# Patient Record
Sex: Female | Born: 1935 | Race: White | Hispanic: No | Marital: Single | Smoking: Former smoker
Health system: Southern US, Community
[De-identification: ages and names within clinical notes are randomized; demographics above are authoritative.]

## PROBLEM LIST (undated history)

## (undated) DIAGNOSIS — M810 Age-related osteoporosis without current pathological fracture: Secondary | ICD-10-CM

## (undated) DIAGNOSIS — R002 Palpitations: Secondary | ICD-10-CM

## (undated) DIAGNOSIS — Z8709 Personal history of other diseases of the respiratory system: Secondary | ICD-10-CM

## (undated) DIAGNOSIS — K5790 Diverticulosis of intestine, part unspecified, without perforation or abscess without bleeding: Secondary | ICD-10-CM

## (undated) DIAGNOSIS — I8393 Asymptomatic varicose veins of bilateral lower extremities: Secondary | ICD-10-CM

## (undated) DIAGNOSIS — E785 Hyperlipidemia, unspecified: Secondary | ICD-10-CM

## (undated) DIAGNOSIS — T8859XA Other complications of anesthesia, initial encounter: Secondary | ICD-10-CM

## (undated) DIAGNOSIS — E119 Type 2 diabetes mellitus without complications: Secondary | ICD-10-CM

## (undated) DIAGNOSIS — S2249XA Multiple fractures of ribs, unspecified side, initial encounter for closed fracture: Secondary | ICD-10-CM

## (undated) DIAGNOSIS — T4145XA Adverse effect of unspecified anesthetic, initial encounter: Secondary | ICD-10-CM

## (undated) DIAGNOSIS — S299XXA Unspecified injury of thorax, initial encounter: Secondary | ICD-10-CM

## (undated) DIAGNOSIS — I10 Essential (primary) hypertension: Secondary | ICD-10-CM

## (undated) DIAGNOSIS — S0292XA Unspecified fracture of facial bones, initial encounter for closed fracture: Secondary | ICD-10-CM

## (undated) DIAGNOSIS — S2239XA Fracture of one rib, unspecified side, initial encounter for closed fracture: Secondary | ICD-10-CM

## (undated) DIAGNOSIS — K649 Unspecified hemorrhoids: Secondary | ICD-10-CM

## (undated) HISTORY — DX: Essential (primary) hypertension: I10

## (undated) HISTORY — DX: Hyperlipidemia, unspecified: E78.5

## (undated) HISTORY — DX: Age-related osteoporosis without current pathological fracture: M81.0

## (undated) HISTORY — PX: APPENDECTOMY: SHX54

## (undated) HISTORY — PX: VARICOSE VEIN SURGERY: SHX832

## (undated) HISTORY — PX: CHOLECYSTECTOMY: SHX55

## (undated) HISTORY — PX: EYE SURGERY: SHX253

## (undated) HISTORY — DX: Diverticulosis of intestine, part unspecified, without perforation or abscess without bleeding: K57.90

## (undated) HISTORY — DX: Unspecified hemorrhoids: K64.9

## (undated) HISTORY — DX: Type 2 diabetes mellitus without complications: E11.9

## (undated) HISTORY — PX: HEMORRHOID SURGERY: SHX153

## (undated) HISTORY — PX: TUBAL LIGATION: SHX77

---

## 2014-04-11 ENCOUNTER — Encounter: Payer: Self-pay | Admitting: *Deleted

## 2014-04-12 ENCOUNTER — Encounter: Payer: Self-pay | Admitting: Cardiology

## 2014-04-12 ENCOUNTER — Ambulatory Visit (INDEPENDENT_AMBULATORY_CARE_PROVIDER_SITE_OTHER): Payer: Medicare HMO | Admitting: Cardiology

## 2014-04-12 VITALS — BP 130/72 | HR 59 | Ht >= 80 in | Wt 205.8 lb

## 2014-04-12 DIAGNOSIS — R002 Palpitations: Secondary | ICD-10-CM

## 2014-04-12 NOTE — Progress Notes (Signed)
HPI The patient presents for evaluation of palpitations.  She reports that she has had 2 episodes of this about 2 weeks apart.  The first episode occurred after she was taking a shower.  She said that her heart started beating fast.  She felt anxious.  She said that her rate was about 99 when she took it.  She had to go rest and she thinks her symptoms slowly resolved.  However, she did not feel well after this.  She denies any syncope.  She had another episode while at her MDs office on 6/2.  She didn't necessarily feel her heart beating fast at that time but she did have an increased HR in the high 90s.  She says that because of these episodes she has not wanted to do any activity including walking to her mailbox.  She has had some chronic DOE that has slowly progressed over time.  She has had some mild chest tightness with activity but this has been chronic and she does not think that this has gotten worse.    Not on File  Current Outpatient Prescriptions  Medication Sig Dispense Refill  . aspirin 81 MG tablet Take 81 mg by mouth daily.      . Cholecalciferol (D2000 ULTRA STRENGTH) 2000 UNITS CAPS Take by mouth.      . cyanocobalamin (,VITAMIN B-12,) 1000 MCG/ML injection Inject into the muscle.      . Cyanocobalamin (VITAMIN B-12) 2500 MCG SUBL Place under the tongue.      . pentoxifylline (TRENTAL) 400 MG CR tablet       . simvastatin (ZOCOR) 20 MG tablet       . valsartan-hydrochlorothiazide (DIOVAN-HCT) 160-12.5 MG per tablet       . verapamil (CALAN) 120 MG tablet        No current facility-administered medications for this visit.    Past Medical History  Diagnosis Date  . HTN (hypertension)   . Diabetes mellitus     borderline  . Hyperlipidemia   . Osteoporosis   . Hemorrhoid   . Diverticulosis     Past Surgical History  Procedure Laterality Date  . Cholecystectomy    . Tubal ligation    . Varicose vein surgery      Family History  Problem Relation Age of Onset  .  Colon cancer Mother     History   Social History  . Marital Status: Single    Spouse Name: N/A    Number of Children: 5  . Years of Education: N/A   Occupational History  . Not on file.   Social History Main Topics  . Smoking status: Never Smoker   . Smokeless tobacco: Not on file  . Alcohol Use: Not on file  . Drug Use: Not on file  . Sexual Activity: Not on file   Other Topics Concern  . Not on file   Social History Narrative   Lives alone.     ROS:  PHYSICAL EXAM BP 130/72  Pulse 59  Ht 7' (2.134 m)  Wt 205 lb 12.8 oz (93.35 kg)  BMI 20.50 kg/m2 GENERAL:  Well appearing HEENT:  Pupils equal round and reactive, fundi not visualized, oral mucosa unremarkable NECK:  No jugular venous distention, waveform within normal limits, carotid upstroke brisk and symmetric, no bruits, no thyromegaly LYMPHATICS:  No cervical, inguinal adenopathy LUNGS:  Clear to auscultation bilaterally BACK:  No CVA tenderness CHEST:  Unremarkable HEART:  PMI not displaced or sustained,S1 and  S2 within normal limits, no S3, no S4, no clicks, no rubs, no murmurs ABD:  Flat, positive bowel sounds normal in frequency in pitch, no bruits, no rebound, no guarding, no midline pulsatile mass, no hepatomegaly, no splenomegaly EXT:  2 plus pulses throughout, no edema, no cyanosis no clubbing, varicose veins.  SKIN:  No rashes no nodules NEURO:  Cranial nerves II through XII grossly intact, motor grossly intact throughout PSYCH:  Cognitively intact, oriented to person place and time   EKG:   NSR, rate 74, axis WNL, intervals WNL, low voltage limb leads.  No acute ST T wave changes.  04/12/2014  ASSESSMENT AND PLAN  PALPITATIONS: I will start with a 48 hour Holter.  Of note labs, including TSH, are normal.  If this is negative I will likely check a POET (Plain Old Exercise Treadmill).  After these results she and I can discuss by phone, since she lives 2.5 hours away, the results and possible therapy.     HTN:   The blood pressure is at target. No change in medications is indicated. We will continue with therapeutic lifestyle changes (TLC).

## 2014-04-12 NOTE — Patient Instructions (Signed)
The current medical regimen is effective;  continue present plan and medications.  Your physician has recommended that you wear a holter monitor. Holter monitors are medical devices that record the heart's electrical activity. Doctors most often use these monitors to diagnose arrhythmias. Arrhythmias are problems with the speed or rhythm of the heartbeat. The monitor is a small, portable device. You can wear one while you do your normal daily activities. This is usually used to diagnose what is causing palpitations/syncope (passing out).  Your physician has requested that you have an exercise tolerance test. For further information please visit [email or web address] Please also follow instruction sheet, as given.

## 2014-04-18 ENCOUNTER — Encounter (INDEPENDENT_AMBULATORY_CARE_PROVIDER_SITE_OTHER): Payer: Medicare HMO

## 2014-04-18 ENCOUNTER — Encounter: Payer: Self-pay | Admitting: Radiology

## 2014-04-18 DIAGNOSIS — R002 Palpitations: Secondary | ICD-10-CM

## 2014-04-18 NOTE — Progress Notes (Signed)
Patient ID: Heather Cisneros, female   DOB: 05/15/1936, 78 y.o.   MRN: [id number] 24hr holter monitor applied

## 2014-04-21 ENCOUNTER — Telehealth (HOSPITAL_COMMUNITY): Payer: Self-pay

## 2014-04-22 ENCOUNTER — Ambulatory Visit (HOSPITAL_COMMUNITY)
Admission: RE | Admit: 2014-04-22 | Discharge: 2014-04-22 | Disposition: A | Discharge status: Home / Self Care | Payer: Medicare HMO | Source: Ambulatory Visit | Attending: Cardiovascular Disease | Admitting: Cardiovascular Disease

## 2014-04-22 DIAGNOSIS — R000 Tachycardia, unspecified: Secondary | ICD-10-CM

## 2014-04-22 DIAGNOSIS — R002 Palpitations: Secondary | ICD-10-CM

## 2014-04-22 DIAGNOSIS — I10 Essential (primary) hypertension: Secondary | ICD-10-CM

## 2014-04-22 NOTE — Procedures (Addendum)
Exercise Treadmill Test  Pre-Exercise Testing Evaluation Rhythm: normal sinus  Rate: 81   PR:  .17 QRS:  .08  QT:  .34 QTc: .36      ST Segments:  no significant ST changes at rest     Test  Exercise Tolerance Test Ordering MD: Minus Breeding, MD  Interpreting MD  Corky Downs, MD  Unique Test No: 1  Treadmill:  1  Indication for ETT: chest pain - rule out ischemia  Contraindication to ETT: Yes   Stress Modality: exercise - treadmill  Cardiac Imaging Performed: non   Protocol: standard Bruce - maximal  Max BP:  153/80  Max MPHR (bpm):  145 85% MPR (bpm):  123  MPHR obtained (bpm):  144 % MPHR obtained:  99  Reached 85% MPHR (min:sec): 2:20 Total Exercise Time (min-sec):  5:22  Workload in METS:  7.00   Reason ETT Terminated:  fatigue    ST Segment Analysis At Rest: normal ST segments - no evidence of significant ST depression With Exercise: no evidence of significant ST depression  Other Information Arrhythmia:  No Angina during ETT:  absent (0) Quality of ETT:  diagnostic  ETT Interpretation:  normal - no evidence of ischemia by ST analysis  Comments: Normal GXT with patient exercising to a 7 met workload and 99% APMHR. Duke Treadmill Score 6.

## 2014-04-29 NOTE — Telephone Encounter (Signed)
The pt states that Dr Antoine PocheHochrein called her day before yesterday (Wednesday 6/24) and left a message on her VM stating that he wanted to talk to her about some test results. I discussed the preliminary results of her GXT with her. She is advised that Dr Antoine PocheHochrein is not in this office today and that I am forwarding this message to him so he will know that she did call him back.

## 2014-04-29 NOTE — Telephone Encounter (Signed)
Called to discuss.  No change in therapy.

## 2014-04-29 NOTE — Telephone Encounter (Signed)
The pts phone is busy, will continue to call.

## 2014-04-29 NOTE — Telephone Encounter (Signed)
New message  ° ° °Patient calling for test results.   °

## 2015-06-14 ENCOUNTER — Other Ambulatory Visit: Payer: Self-pay | Admitting: Gastroenterology

## 2015-06-14 ENCOUNTER — Encounter (HOSPITAL_COMMUNITY): Payer: Self-pay | Admitting: *Deleted

## 2015-06-20 ENCOUNTER — Encounter (HOSPITAL_COMMUNITY): Payer: Self-pay

## 2015-06-20 ENCOUNTER — Encounter (HOSPITAL_COMMUNITY)
Admission: RE | Disposition: A | Discharge status: Home / Self Care | Payer: Self-pay | Source: Ambulatory Visit | Attending: Gastroenterology

## 2015-06-20 ENCOUNTER — Ambulatory Visit (HOSPITAL_COMMUNITY): Payer: Medicare PPO | Admitting: Anesthesiology

## 2015-06-20 ENCOUNTER — Ambulatory Visit (HOSPITAL_COMMUNITY)
Admission: RE | Admit: 2015-06-20 | Discharge: 2015-06-20 | Disposition: A | Discharge status: Home / Self Care | Payer: Medicare PPO | Source: Ambulatory Visit | Attending: Gastroenterology | Admitting: Gastroenterology

## 2015-06-20 DIAGNOSIS — D509 Iron deficiency anemia, unspecified: Secondary | ICD-10-CM | POA: Insufficient documentation

## 2015-06-20 DIAGNOSIS — Z7982 Long term (current) use of aspirin: Secondary | ICD-10-CM | POA: Insufficient documentation

## 2015-06-20 DIAGNOSIS — K449 Diaphragmatic hernia without obstruction or gangrene: Secondary | ICD-10-CM | POA: Insufficient documentation

## 2015-06-20 DIAGNOSIS — E119 Type 2 diabetes mellitus without complications: Secondary | ICD-10-CM | POA: Insufficient documentation

## 2015-06-20 DIAGNOSIS — K2970 Gastritis, unspecified, without bleeding: Secondary | ICD-10-CM | POA: Insufficient documentation

## 2015-06-20 DIAGNOSIS — I10 Essential (primary) hypertension: Secondary | ICD-10-CM | POA: Insufficient documentation

## 2015-06-20 DIAGNOSIS — Z800 Family history of malignant neoplasm of digestive organs: Secondary | ICD-10-CM | POA: Insufficient documentation

## 2015-06-20 DIAGNOSIS — E780 Pure hypercholesterolemia: Secondary | ICD-10-CM | POA: Diagnosis not present

## 2015-06-20 DIAGNOSIS — R195 Other fecal abnormalities: Secondary | ICD-10-CM | POA: Diagnosis not present

## 2015-06-20 DIAGNOSIS — K5900 Constipation, unspecified: Secondary | ICD-10-CM | POA: Diagnosis not present

## 2015-06-20 DIAGNOSIS — Z86010 Personal history of colonic polyps: Secondary | ICD-10-CM | POA: Insufficient documentation

## 2015-06-20 DIAGNOSIS — Z87891 Personal history of nicotine dependence: Secondary | ICD-10-CM | POA: Diagnosis not present

## 2015-06-20 HISTORY — PX: COLONOSCOPY WITH PROPOFOL: SHX5780

## 2015-06-20 HISTORY — PX: ESOPHAGOGASTRODUODENOSCOPY (EGD) WITH PROPOFOL: SHX5813

## 2015-06-20 SURGERY — ESOPHAGOGASTRODUODENOSCOPY (EGD) WITH PROPOFOL
Anesthesia: Monitor Anesthesia Care

## 2015-06-20 MED ORDER — BUTAMBEN-TETRACAINE-BENZOCAINE 2-2-14 % EX AERO
INHALATION_SPRAY | CUTANEOUS | Status: DC | PRN
  Administered 2015-06-20: 1 via TOPICAL

## 2015-06-20 MED ORDER — LIDOCAINE HCL (CARDIAC) 20 MG/ML IV SOLN
INTRAVENOUS | Status: AC
  Filled 2015-06-20: qty 5

## 2015-06-20 MED ORDER — PROPOFOL 10 MG/ML IV BOLUS
INTRAVENOUS | Status: AC
  Filled 2015-06-20: qty 20

## 2015-06-20 MED ORDER — PROPOFOL 500 MG/50ML IV EMUL
INTRAVENOUS | Status: DC | PRN
  Administered 2015-06-20: 10 mg via INTRAVENOUS
  Administered 2015-06-20: 20 mg via INTRAVENOUS
  Administered 2015-06-20: 40 mg via INTRAVENOUS
  Administered 2015-06-20: 10 mg via INTRAVENOUS

## 2015-06-20 MED ORDER — PROPOFOL INFUSION 10 MG/ML OPTIME
INTRAVENOUS | Status: DC | PRN
  Administered 2015-06-20: 100 ug/kg/min via INTRAVENOUS

## 2015-06-20 MED ORDER — SODIUM CHLORIDE 0.9 % IV SOLN: INTRAVENOUS | Status: DC

## 2015-06-20 MED ORDER — LACTATED RINGERS IV SOLN
INTRAVENOUS | Status: DC
  Administered 2015-06-20: 1000 mL via INTRAVENOUS

## 2015-06-20 SURGICAL SUPPLY — 24 items

## 2015-06-20 NOTE — H&P (Signed)
  Problem: Iron deficiency anemia. Heme positive stool on aspirin. Hemoglobin 11.7 g. Serum iron saturation 20%. Serum ferritin 7.5 ng/mL. Chronic daily aspirin use. Daily heartburn. 12/12/2011 colonoscopy performed with removal of a diminutive transverse colon polyp and [place], [place]  History: The patient is a 79 year old female born 01-20-1936. The patient was recently diagnosed with iron deficiency anemia, vitamin B 12 deficiency, and heme positive stool taking aspirin 81 mg daily. She denies abdominal pain, nausea, vomiting, diarrhea, gastrointestinal bleeding, hematuria, or hemoptysis.  Her mother was diagnosed with colon cancer. Patient underwent a colonoscopy in February 2013 with removal of a diminutive transverse colon polyp.  The patient has chronic constipation. She has been experiencing intermittent heartburn without dysphagia or odynophagia. She submitted stool cards to her primary care physician and 2 of 3 cards were positive for blood.  The patient is scheduled to undergo diagnostic esophagogastroduodenoscopy with small bowel biopsies and gastric biopsies followed by diagnostic colonoscopy to evaluate iron deficiency anemia and heme positive stool.  Medication allergies: None  Past medical history: Type 2 diabetes mellitus. Hypertension. Hypercholesterolemia. Heartburn. Vitamin B 12 deficiency. Cholecystectomy. Tubal ligation. Hemorrhoid surgery. Varicose pain surgery.  Exam: Patient is alert and lying comfortably on the endoscopy stretcher. Abdomen is soft and nontender to palpation. Lungs are clear to auscultation. Cardiac exam reveals a regular rhythm.  Plan: Proceed with diagnostic esophagogastroduodenoscopy with small bowel biopsies to rule out villous atrophy associated with celiac disease, gastric biopsies to look for H. pylori gastritis, followed by diagnostic colonoscopy.

## 2015-06-20 NOTE — Anesthesia Postprocedure Evaluation (Signed)
  Anesthesia Post-op Note  Patient: Heather Cisneros  Procedure(s) Performed: Procedure(s) (LRB): ESOPHAGOGASTRODUODENOSCOPY (EGD) WITH PROPOFOL (N/A) COLONOSCOPY WITH PROPOFOL (N/A)  Patient Location: PACU  Anesthesia Type: MAC  Level of Consciousness: awake and alert   Airway and Oxygen Therapy: Patient Spontanous Breathing  Post-op Pain: mild  Post-op Assessment: Post-op Vital signs reviewed, Patient's Cardiovascular Status Stable, Respiratory Function Stable, Patent Airway and No signs of Nausea or vomiting  Last Vitals:  Filed Vitals:   06/20/15 1520  BP: 160/93  Pulse: 68  Temp:   Resp: 13    Post-op Vital Signs: stable   Complications: No apparent anesthesia complications

## 2015-06-20 NOTE — Discharge Instructions (Addendum)
Colonoscopy, Care After °These instructions give you information on caring for yourself after your procedure. Your doctor may also give you more specific instructions. Call your doctor if you have any problems or questions after your procedure. °HOME CARE °· Do not drive for 24 hours. °· Do not sign important papers or use machinery for 24 hours. °· You may shower. °· You may go back to your usual activities, but go slower for the first 24 hours. °· Take rest breaks often during the first 24 hours. °· Walk around or use warm packs on your belly (abdomen) if you have belly cramping or gas. °· Drink enough fluids to keep your pee (urine) clear or pale yellow. °· Resume your normal diet. Avoid heavy or fried foods. °· Avoid drinking alcohol for 24 hours or as told by your doctor. °· Only take medicines as told by your doctor. °If a tissue sample (biopsy) was taken during the procedure:  °· Do not take aspirin or blood thinners for 7 days, or as told by your doctor. °· Do not drink alcohol for 7 days, or as told by your doctor. °· Eat soft foods for the first 24 hours. °GET HELP IF: °You still have a small amount of blood in your poop (stool) 2-3 days after the procedure. °GET HELP RIGHT AWAY IF: °· You have more than a small amount of blood in your poop. °· You see clumps of tissue (blood clots) in your poop. °· Your belly is puffy (swollen). °· You feel sick to your stomach (nauseous) or throw up (vomit). °· You have a fever. °· You have belly pain that gets worse and medicine does not help. °MAKE SURE YOU: °· Understand these instructions. °· Will watch your condition. °· Will get help right away if you are not doing well or get worse. °Document Released: 11/23/2010 Document Revised: 10/26/2013 Document Reviewed: 06/28/2013 °ExitCare® Patient Information ©2015 [employer]. This information is not intended to replace advice given to you by your health care provider. Make sure you discuss any questions you have with  your health care provider. °Esophagogastroduodenoscopy °Care After °Refer to this sheet in the next few weeks. These instructions provide you with information on caring for yourself after your procedure. Your caregiver may also give you more specific instructions. Your treatment has been planned according to current medical practices, but problems sometimes occur. Call your caregiver if you have any problems or questions after your procedure.  °HOME CARE INSTRUCTIONS °· Do not eat or drink anything until the numbing medicine (local anesthetic) has worn off and your gag reflex has returned. You will know that the local anesthetic has worn off when you can swallow comfortably. °· Do not drive for 12 hours after the procedure or as directed by your caregiver. °· Only take medicines as directed by your caregiver. °SEEK MEDICAL CARE IF:  °· You cannot stop coughing. °· You are not urinating at all or less than usual. °SEEK IMMEDIATE MEDICAL CARE IF: °· You have difficulty swallowing. °· You cannot eat or drink. °· You have worsening throat or chest pain. °· You have dizziness, lightheadedness, or you faint. °· You have nausea or vomiting. °· You have chills. °· You have a fever. °· You have severe abdominal pain. °· You have black, tarry, or bloody stools. °Document Released: 10/07/2012 Document Reviewed: 10/07/2012 °ExitCare® Patient Information ©2015 [employer]. This information is not intended to replace advice given to you by your health care provider. Make sure you   discuss any questions you have with your health care provider. ° °

## 2015-06-20 NOTE — Transfer of Care (Signed)
Immediate Anesthesia Transfer of Care Note  Patient: Heather Cisneros  Procedure(s) Performed: Procedure(s): ESOPHAGOGASTRODUODENOSCOPY (EGD) WITH PROPOFOL (N/A) COLONOSCOPY WITH PROPOFOL (N/A)  Patient Location: PACU  Anesthesia Type:MAC  Level of Consciousness:  sedated, patient cooperative and responds to stimulation  Airway & Oxygen Therapy:Patient Spontanous Breathing  Post-op Assessment:  Report given to PACU RN and Post -op Vital signs reviewed and stable  Post vital signs:  Reviewed and stable  Last Vitals:  Filed Vitals:   06/20/15 1339  BP: 195/93  Pulse: 71  Temp: 36.7 C  Resp: 16    Complications: No apparent anesthesia complications

## 2015-06-20 NOTE — Anesthesia Preprocedure Evaluation (Addendum)
Anesthesia Evaluation  Patient identified by MRN, date of birth, ID band Patient awake    Reviewed: Allergy & Precautions, NPO status , Patient's Chart, lab work & pertinent test results  Airway Mallampati: II  TM Distance: >3 FB Neck ROM: Full    Dental no notable dental hx.    Pulmonary neg pulmonary ROS, former smoker,  breath sounds clear to auscultation  Pulmonary exam normal       Cardiovascular hypertension, Pt. on medications negative cardio ROS Normal cardiovascular examRhythm:Regular Rate:Normal     Neuro/Psych negative neurological ROS  negative psych ROS   GI/Hepatic negative GI ROS, Neg liver ROS,   Endo/Other  negative endocrine ROSdiabetes  Renal/GU negative Renal ROS  negative genitourinary   Musculoskeletal negative musculoskeletal ROS (+)   Abdominal   Peds negative pediatric ROS (+)  Hematology negative hematology ROS (+)   Anesthesia Other Findings   Reproductive/Obstetrics negative OB ROS                           Anesthesia Physical Anesthesia Plan  ASA: II  Anesthesia Plan: MAC   Post-op Pain Management:    Induction:   Airway Management Planned: Natural Airway  Additional Equipment:   Intra-op Plan:   Post-operative Plan:   Informed Consent: I have reviewed the patients History and Physical, chart, labs and discussed the procedure including the risks, benefits and alternatives for the proposed anesthesia with the patient or authorized representative who has indicated his/her understanding and acceptance.   Dental advisory given  Plan Discussed with: CRNA  Anesthesia Plan Comments:         Anesthesia Quick Evaluation

## 2015-06-20 NOTE — Op Note (Signed)
Problem: Iron deficiency anemia. Heme positive stool on aspirin. Hemoglobin 11.7 g. Serum iron saturation 20%. Serum ferritin 7.5 ng/mL. 12/12/2011 colonoscopy performed with removal of a diminutive transverse colon polyp in [place], [place].  Endoscopist: Danise Edge  Premedication: Propofol administered by anesthesia  Procedure: Diagnostic esophagogastroduodenoscopy The patient was placed in the left lateral decubitus position. The Pentax gastroscope was passed through the posterior hypopharynx into the proximal esophagus without difficulty. The hypopharynx, larynx, and vocal cords appeared normal.  Esophagoscopy: The proximal, mid, and lower segments of the esophageal mucosa appeared normal. The squamocolumnar junction was regular in appearance and noted at 35 cm from the incisor teeth.  Gastroscopy: There was a moderate sized hiatal hernia. Retroflexed view of the gastric cardia and fundus was normal. The diaphragmatic hiatus was patulous. The gastric body, antrum, and pylorus appeared normal. Random gastric biopsies were performed to look for H. pylori gastritis.  Duodenoscopy: The duodenal bulb and descending duodenum appeared normal. Biopsies were performed from the distal duodenal bulb and second portion of duodenum to look for signs of celiac disease.  Assessment: Normal esophagogastroduodenoscopy except for the presence of a moderate sized hiatal hernia. Duodenal biopsies to look for signs of celiac disease and gastric biopsies to look for H. pylori gastritis.  Procedure: Diagnostic colonoscopy Anal inspection and digital rectal exam were normal. The Pentax pediatric colonoscope was introduced into the rectum and advanced to the cecum. A normal-appearing ileocecal valve and appendiceal orifice were identified. Colonic preparation for the exam today was good. Withdrawal time was 11 minutes  Rectum. Normal.  Sigmoid colon and descending colon. Left colonic  diverticulosis  Splenic flexure. Normal  Transverse colon. Normal  Hepatic flexure. Normal  Ascending colon. Normal  Cecum and ileocecal valve. Normal  Assessment: Normal diagnostic colonoscopy.

## 2015-06-21 ENCOUNTER — Encounter (HOSPITAL_COMMUNITY): Payer: Self-pay | Admitting: Gastroenterology

## 2016-09-12 ENCOUNTER — Encounter: Payer: Self-pay | Admitting: Surgery

## 2016-09-12 ENCOUNTER — Ambulatory Visit: Payer: Self-pay | Admitting: Surgery

## 2016-09-12 NOTE — H&P (Signed)
Heather Cisneros 09/12/2016 10:40 AM Location: [facility] Patient #: [id number] DOB: 11/15/1935 Single / Language: [name] / Race: White Female  History of Present Illness (Heather A. Fredricka Bonineonnor MD; 09/12/2016 10:56 AM) Patient words: This is a very nice 80 year old woman with a long-standing history of lower back cyst. It is never been excised, but it has been lanced once in the past and on another occasion it ruptured without intervention. It has recurred, and has been present for about 5 years and slowly enlarging. It is not painful but she describes it as feeling "bubbly". She denies any associated fevers.  The patient is a 80 year old female.   Other Problems Heather Cisneros(Armen Glenn, CMA; 09/12/2016 10:40 AM) Back Pain Cholelithiasis Chronic Renal Failure Syndrome Hemorrhoids High blood pressure  Diagnostic Studies History Heather Cisneros(Armen Glenn, CMA; 09/12/2016 10:40 AM) Colonoscopy within last year Mammogram >3 years ago Pap Smear >5 years ago  Allergies Heather Cisneros(Armen Glenn, CMA; 09/12/2016 10:42 AM) No Known Drug Allergies 09/12/2016  Medication History (Armen Sherrine MaplesGlenn, CMA; 09/12/2016 10:46 AM) Triamcinolone Acetonide (0.1% Ointment, External) Active. Simvastatin (20MG  Tablet, Oral) Active. Verapamil HCl (120MG  Tablet, Oral) Active. Valsartan-Hydrochlorothiazide (160-12.5MG  Tablet, Oral) Active. Pentoxifylline ER (400MG  Tablet ER, Oral) Active. Caltrate 600+D3 Soft (600-800MG -UNIT Tablet Chewable, Oral) Active. Vitamin B-12 (2500MCG Tab Sublingual, Sublingual) Active. Cranberry Concentrate (500MG  Capsule, Oral) Active. Resveratrol Plus (100-100MG  Tablet, Oral) Active. Cetirizine HCl (10MG  Tablet, Oral) Active. Medications Reconciled  Social History Heather Cisneros(Armen Glenn, CMA; 09/12/2016 10:40 AM) Alcohol use Occasional alcohol use. Caffeine use Carbonated beverages, Coffee, Tea. No drug use Tobacco use Former smoker.  Family History Heather Cisneros(Armen Glenn, CMA; 09/12/2016 10:40  AM) Alcohol Abuse Father. Colon Cancer Mother. Thyroid problems Brother, Mother.  Pregnancy / Birth History Heather Cisneros(Armen Glenn, CMA; 09/12/2016 10:40 AM) Age at menarche 15 years. Age of menopause 3351-55 Gravida 5 Maternal age 78-20 Para 5     Review of Systems Heather Cisneros(Armen Glenn CMA; 09/12/2016 10:40 AM) General Not Present- Appetite Loss, Chills, Fatigue, Fever, Night Sweats, Weight Gain and Weight Loss. Skin Present- Rash. Not Present- Change in Wart/Mole, Dryness, Hives, Jaundice, New Lesions, Non-Healing Wounds and Ulcer. HEENT Present- Seasonal Allergies. Not Present- Earache, Hearing Loss, Hoarseness, Nose Bleed, Oral Ulcers, Ringing in the Ears, Sinus Pain, Sore Throat, Visual Disturbances, Wears glasses/contact lenses and Yellow Eyes. Respiratory Not Present- Bloody sputum, Chronic Cough, Difficulty Breathing, Snoring and Wheezing. Breast Not Present- Breast Mass, Breast Pain, Nipple Discharge and Skin Changes. Cardiovascular Not Present- Chest Pain, Difficulty Breathing Lying Down, Leg Cramps, Palpitations, Rapid Heart Rate, Shortness of Breath and Swelling of Extremities. Gastrointestinal Present- Constipation. Not Present- Abdominal Pain, Bloating, Bloody Stool, Change in Bowel Habits, Chronic diarrhea, Difficulty Swallowing, Excessive gas, Gets full quickly at meals, Hemorrhoids, Indigestion, Nausea, Rectal Pain and Vomiting. Female Genitourinary Present- Nocturia. Not Present- Frequency, Painful Urination, Pelvic Pain and Urgency. Musculoskeletal Present- Back Pain and Joint Pain. Not Present- Joint Stiffness, Muscle Pain, Muscle Weakness and Swelling of Extremities. Neurological Not Present- Decreased Memory, Fainting, Headaches, Numbness, Seizures, Tingling, Tremor, Trouble walking and Weakness. Psychiatric Not Present- Anxiety, Bipolar, Change in Sleep Pattern, Depression, Fearful and Frequent crying. Endocrine Not Present- Cold Intolerance, Excessive Hunger, Hair Changes, Heat  Intolerance, Hot flashes and New Diabetes. Hematology Not Present- Blood Thinners, Easy Bruising, Excessive bleeding, Gland problems, HIV and Persistent Infections.  Vitals (Armen Glenn CMA; 09/12/2016 10:42 AM) 09/12/2016 10:41 AM Weight: 197.25 lb Height: 64in Body Surface Area: 1.94 m Body Mass Index: 33.86 kg/m  Temp.: 98.109F  Pulse: 99 (Regular)  P.OX: 97% (Room air)  BP: 122/84 (Sitting, Left Arm, Standard)      Physical Exam (Heather A. Fredricka Bonineonnor MD; 09/12/2016 10:58 AM)  The physical exam findings are as follows: Note:She is alert and oriented, appears stated age Extraocular motion intact, pupils equally round and reactive Moist mucous membranes. Normal hearing Neck without mass or thyromegaly Unlabored respirations, clear bilaterally Regular rate and rhythm, palpable radial pulses bilaterally. Bilateral lower artery venous insufficiency, currently wearing compression stockings Abdomen soft, nontender, no organomegaly or mass Extremities warm without deformity. Neuro grossly intact, no motor or sensory deficits Psych normal mood and affect Skin: On the back just to the patient's right of midline there is a 4-5 cm cystic subcutaneous mass which is freely mobile, smooth, nontender. There is scarring of the overlying skin but no erythema, induration, or warmth    Assessment & Plan (Heather A. Fredricka Bonineonnor MD; 09/12/2016 10:58 AM)  EPIDERMAL INCLUSION CYST (Working Diagnosis) (L72.0) Story: Lower back, approximately 4-5cm diameter. Will plan excision under anesthesia. -If fever, worsening pain, redness or drainage between now and surgery, you may need to see either primary doctor or us to be treated with antibiotics  Current Plans You are being scheduled for surgery - Our schedulers will call you.  You should hear from our office's scheduling department within 5 working days about the location, date, and time of surgery. We try to make accommodations for patient's  preferences in scheduling surgery, but sometimes the OR schedule or the surgeon's schedule prevents us from making those accommodations.  If you have not heard from our office [phone]) in 5 working days, call the office and ask for your surgeon's nurse.  If you have other questions about your diagnosis, plan, or surgery, call the office and ask for your surgeon's nurse.

## 2016-10-29 ENCOUNTER — Encounter (HOSPITAL_COMMUNITY)
Admission: RE | Admit: 2016-10-29 | Discharge: 2016-10-29 | Disposition: A | Discharge status: Home / Self Care | Payer: Medicare HMO | Source: Ambulatory Visit | Attending: Surgery | Admitting: Surgery

## 2016-10-29 ENCOUNTER — Ambulatory Visit (HOSPITAL_COMMUNITY)
Admission: RE | Admit: 2016-10-29 | Discharge: 2016-10-29 | Disposition: A | Discharge status: Home / Self Care | Payer: Medicare HMO | Source: Ambulatory Visit | Attending: Surgery | Admitting: Surgery

## 2016-10-29 ENCOUNTER — Encounter (HOSPITAL_COMMUNITY): Payer: Self-pay

## 2016-10-29 DIAGNOSIS — Z01810 Encounter for preprocedural cardiovascular examination: Secondary | ICD-10-CM | POA: Insufficient documentation

## 2016-10-29 DIAGNOSIS — I10 Essential (primary) hypertension: Secondary | ICD-10-CM | POA: Insufficient documentation

## 2016-10-29 DIAGNOSIS — E785 Hyperlipidemia, unspecified: Secondary | ICD-10-CM | POA: Insufficient documentation

## 2016-10-29 DIAGNOSIS — Z01818 Encounter for other preprocedural examination: Secondary | ICD-10-CM | POA: Diagnosis present

## 2016-10-29 DIAGNOSIS — K5790 Diverticulosis of intestine, part unspecified, without perforation or abscess without bleeding: Secondary | ICD-10-CM | POA: Insufficient documentation

## 2016-10-29 DIAGNOSIS — Z01812 Encounter for preprocedural laboratory examination: Secondary | ICD-10-CM | POA: Insufficient documentation

## 2016-10-29 DIAGNOSIS — M810 Age-related osteoporosis without current pathological fracture: Secondary | ICD-10-CM | POA: Insufficient documentation

## 2016-10-29 DIAGNOSIS — Z8709 Personal history of other diseases of the respiratory system: Secondary | ICD-10-CM | POA: Diagnosis not present

## 2016-10-29 DIAGNOSIS — I700 Atherosclerosis of aorta: Secondary | ICD-10-CM | POA: Diagnosis not present

## 2016-10-29 DIAGNOSIS — E119 Type 2 diabetes mellitus without complications: Secondary | ICD-10-CM | POA: Insufficient documentation

## 2016-10-29 HISTORY — DX: Asymptomatic varicose veins of bilateral lower extremities: I83.93

## 2016-10-29 HISTORY — DX: Unspecified injury of thorax, initial encounter: S29.9XXA

## 2016-10-29 HISTORY — DX: Personal history of other diseases of the respiratory system: Z87.09

## 2016-10-29 HISTORY — DX: Unspecified fracture of facial bones, initial encounter for closed fracture: S02.92XA

## 2016-10-29 HISTORY — DX: Multiple fractures of ribs, unspecified side, initial encounter for closed fracture: S22.49XA

## 2016-10-29 HISTORY — DX: Adverse effect of unspecified anesthetic, initial encounter: T41.45XA

## 2016-10-29 HISTORY — DX: Palpitations: R00.2

## 2016-10-29 HISTORY — DX: Other complications of anesthesia, initial encounter: T88.59XA

## 2016-10-29 HISTORY — DX: Fracture of one rib, unspecified side, initial encounter for closed fracture: S22.39XA

## 2016-10-29 LAB — CBC WITH DIFFERENTIAL/PLATELET
BASOS PCT: 0 %
Basophils Absolute: 0 10*3/uL (ref 0.0–0.1)
EOS PCT: 4 %
Eosinophils Absolute: 0.2 10*3/uL (ref 0.0–0.7)
HEMATOCRIT: 39.8 % (ref 36.0–46.0)
Hemoglobin: 13.9 g/dL (ref 12.0–15.0)
Lymphocytes Relative: 26 %
Lymphs Abs: 1.6 10*3/uL (ref 0.7–4.0)
MCH: 29.2 pg (ref 26.0–34.0)
MCHC: 34.9 g/dL (ref 30.0–36.0)
MCV: 83.6 fL (ref 78.0–100.0)
MONO ABS: 0.6 10*3/uL (ref 0.1–1.0)
Monocytes Relative: 9 %
NEUTROS ABS: 3.6 10*3/uL (ref 1.7–7.7)
Neutrophils Relative %: 61 %
PLATELETS: 314 10*3/uL (ref 150–400)
RBC: 4.76 MIL/uL (ref 3.87–5.11)
RDW: 12.7 % (ref 11.5–15.5)
WBC: 6 10*3/uL (ref 4.0–10.5)

## 2016-10-29 LAB — COMPREHENSIVE METABOLIC PANEL
ALBUMIN: 4.7 g/dL (ref 3.5–5.0)
ALT: 25 U/L (ref 14–54)
AST: 55 U/L — AB (ref 15–41)
Alkaline Phosphatase: 71 U/L (ref 38–126)
Anion gap: 10 (ref 5–15)
BUN: 15 mg/dL (ref 6–20)
CHLORIDE: 94 mmol/L — AB (ref 101–111)
CO2: 26 mmol/L (ref 22–32)
Calcium: 9.7 mg/dL (ref 8.9–10.3)
Creatinine, Ser: 1.24 mg/dL — ABNORMAL HIGH (ref 0.44–1.00)
GFR calc Af Amer: 47 mL/min — ABNORMAL LOW (ref >60)
GFR calc non Af Amer: 41 mL/min — ABNORMAL LOW (ref >60)
GLUCOSE: 121 mg/dL — AB (ref 65–99)
POTASSIUM: 4.2 mmol/L (ref 3.5–5.1)
Sodium: 130 mmol/L — ABNORMAL LOW (ref 135–145)
Total Bilirubin: 0.7 mg/dL (ref 0.3–1.2)
Total Protein: 7.3 g/dL (ref 6.5–8.1)

## 2016-10-29 LAB — GLUCOSE, CAPILLARY: Glucose-Capillary: 117 mg/dL — ABNORMAL HIGH (ref 65–99)

## 2016-10-29 LAB — APTT: APTT: 28 s (ref 24–36)

## 2016-10-29 LAB — PROTIME-INR
INR: 0.99
Prothrombin Time: 13.1 seconds (ref 11.4–15.2)

## 2016-10-29 NOTE — Progress Notes (Signed)
Spoke with Dr Marlow BaarsK Jackson/anesthesia for clarification to Trental taken am of surgery. Okay for pt to take.

## 2016-10-29 NOTE — Patient Instructions (Signed)
Heather Cisneros  10/29/2016   Your procedure is scheduled on: Friday November 01, 2016  Report to [facility] Main  Entrance take [place]  elevators to 3rd floor to  Short Stay Center at 9:00 AM.  Call this number if you have problems the morning of surgery [phone]   Remember: ONLY 1 PERSON MAY GO WITH YOU TO SHORT STAY TO GET  READY MORNING OF YOUR SURGERY.  Do not eat food or drink liquids :After Midnight.     Take these medicines the morning of surgery with A SIP OF WATER: Ceterizine (Zyrtec); Verapamil; Trental (Pentoxifylline)  DO NOT TAKE ANY DIABETIC MEDICATIONS DAY OF YOUR SURGERY                    How to Manage Your Diabetes Before and After Surgery  Why is it important to control my blood sugar before and after surgery? . Improving blood sugar levels before and after surgery helps healing and can limit problems. . A way of improving blood sugar control is eating a healthy diet by: o  Eating less sugar and carbohydrates o  Increasing activity/exercise o  Talking with your doctor about reaching your blood sugar goals . High blood sugars (greater than 180 mg/dL) can raise your risk of infections and slow your recovery, so you will need to focus on controlling your diabetes during the weeks before surgery. . Make sure that the doctor who takes care of your diabetes knows about your planned surgery including the date and location.  How do I manage my blood sugar before surgery? . Check your blood sugar at least 4 times a day, starting 2 days before surgery, to make sure that the level is not too high or low. o Check your blood sugar the morning of your surgery when you wake up and every 2 hours until you get to the Short Stay unit. . If your blood sugar is less than 70 mg/dL, you will need to treat for low blood sugar: o Do not take insulin. o Treat a low blood sugar (less than 70 mg/dL) with  cup of clear juice (cranberry or apple), 4 glucose tablets,  OR glucose gel. o Recheck blood sugar in 15 minutes after treatment (to make sure it is greater than 70 mg/dL). If your blood sugar is not greater than 70 mg/dL on recheck, call [phone] for further instructions. . Report your blood sugar to the short stay nurse when you get to Short Stay.  . If you are admitted to the hospital after surgery: o Your blood sugar will be checked by the staff and you will probably be given insulin after surgery (instead of oral diabetes medicines) to make sure you have good blood sugar levels. o The goal for blood sugar control after surgery is 80-180 mg/dL.   WHAT DO I DO ABOUT MY DIABETES MEDICATION?  Marland Kitchen Do not take oral diabetes medicines (pills) the morning of surgery.  Patient Signature:  Date:   Nurse Signature:  Date:   Reviewed and Endorsed by [facility] Patient Education Committee, August 2015             You may not have any metal on your body including hair pins and              piercings  Do not wear jewelry, make-up, lotions, powders or perfumes, deodorant  Do not wear nail polish.  Do not shave  48 hours prior to surgery.       Do not bring valuables to the hospital. [facility] IS NOT             RESPONSIBLE   FOR VALUABLES.  Contacts, dentures or bridgework may not be worn into surgery.      Patients discharged the day of surgery will not be allowed to drive home.  Name and phone number of your driver:[name] (daughter)  _____________________________________________________________________             [facility] Before surgery, you can play an important role.  Because skin is not sterile, your skin needs to be as free of germs as possible.  You can reduce the number of germs on your skin by washing with CHG (chlorahexidine gluconate) soap before surgery.  CHG is an antiseptic cleaner which kills germs and bonds with the skin to continue killing germs even after washing. Please DO NOT use  if you have an allergy to CHG or antibacterial soaps.  If your skin becomes reddened/irritated stop using the CHG and inform your nurse when you arrive at Short Stay. Do not shave (including legs and underarms) for at least 48 hours prior to the first CHG shower.  You may shave your face/neck. Please follow these instructions carefully:  1.  Shower with CHG Soap the night before surgery and the  morning of Surgery.  2.  If you choose to wash your hair, wash your hair first as usual with your  normal  shampoo.  3.  After you shampoo, rinse your hair and body thoroughly to remove the  shampoo.                           4.  Use CHG as you would any other liquid soap.  You can apply chg directly  to the skin and wash                       Gently with a scrungie or clean washcloth.  5.  Apply the CHG Soap to your body ONLY FROM THE NECK DOWN.   Do not use on face/ open                           Wound or open sores. Avoid contact with eyes, ears mouth and genitals (private parts).                       Wash face,  Genitals (private parts) with your normal soap.             6.  Wash thoroughly, paying special attention to the area where your surgery  will be performed.  7.  Thoroughly rinse your body with warm water from the neck down.  8.  DO NOT shower/wash with your normal soap after using and rinsing off  the CHG Soap.                9.  Pat yourself dry with a clean towel.            10.  Wear clean pajamas.            11.  Place clean sheets on your bed the night of your first shower and do not  sleep with  pets. Day of Surgery : Do not apply any lotions/deodorants the morning of surgery.  Please wear clean clothes to the hospital/surgery center.  FAILURE TO FOLLOW THESE INSTRUCTIONS MAY RESULT IN THE CANCELLATION OF YOUR SURGERY PATIENT SIGNATURE_________________________________  NURSE  SIGNATURE__________________________________  ________________________________________________________________________

## 2016-10-30 LAB — HEMOGLOBIN A1C
Hgb A1c MFr Bld: 5.7 % — ABNORMAL HIGH (ref 4.8–5.6)
MEAN PLASMA GLUCOSE: 117 mg/dL

## 2016-10-30 NOTE — Progress Notes (Signed)
CMP results in epic per PAT visit 10/29/2016 sent to Dr Milford Cage Connor

## 2016-11-01 ENCOUNTER — Ambulatory Visit (HOSPITAL_COMMUNITY)
Admission: RE | Admit: 2016-11-01 | Discharge: 2016-11-01 | Disposition: A | Discharge status: Home / Self Care | Payer: Medicare HMO | Source: Ambulatory Visit | Attending: Surgery | Admitting: Surgery

## 2016-11-01 ENCOUNTER — Encounter (HOSPITAL_COMMUNITY): Payer: Self-pay | Admitting: *Deleted

## 2016-11-01 ENCOUNTER — Ambulatory Visit (HOSPITAL_COMMUNITY): Payer: Medicare HMO | Admitting: Anesthesiology

## 2016-11-01 ENCOUNTER — Encounter (HOSPITAL_COMMUNITY)
Admission: RE | Disposition: A | Discharge status: Home / Self Care | Payer: Self-pay | Source: Ambulatory Visit | Attending: Surgery

## 2016-11-01 DIAGNOSIS — N186 End stage renal disease: Secondary | ICD-10-CM | POA: Insufficient documentation

## 2016-11-01 DIAGNOSIS — I120 Hypertensive chronic kidney disease with stage 5 chronic kidney disease or end stage renal disease: Secondary | ICD-10-CM | POA: Insufficient documentation

## 2016-11-01 DIAGNOSIS — E669 Obesity, unspecified: Secondary | ICD-10-CM | POA: Insufficient documentation

## 2016-11-01 DIAGNOSIS — Z6834 Body mass index (BMI) 34.0-34.9, adult: Secondary | ICD-10-CM | POA: Insufficient documentation

## 2016-11-01 DIAGNOSIS — L720 Epidermal cyst: Secondary | ICD-10-CM | POA: Insufficient documentation

## 2016-11-01 DIAGNOSIS — E1151 Type 2 diabetes mellitus with diabetic peripheral angiopathy without gangrene: Secondary | ICD-10-CM | POA: Insufficient documentation

## 2016-11-01 DIAGNOSIS — E1122 Type 2 diabetes mellitus with diabetic chronic kidney disease: Secondary | ICD-10-CM | POA: Insufficient documentation

## 2016-11-01 DIAGNOSIS — Z87891 Personal history of nicotine dependence: Secondary | ICD-10-CM | POA: Insufficient documentation

## 2016-11-01 DIAGNOSIS — L723 Sebaceous cyst: Secondary | ICD-10-CM | POA: Diagnosis present

## 2016-11-01 HISTORY — PX: MASS EXCISION: SHX2000

## 2016-11-01 LAB — GLUCOSE, CAPILLARY
Glucose-Capillary: 106 mg/dL — ABNORMAL HIGH (ref 65–99)
Glucose-Capillary: 117 mg/dL — ABNORMAL HIGH (ref 65–99)

## 2016-11-01 SURGERY — EXCISION MASS
Anesthesia: Monitor Anesthesia Care | Site: Back

## 2016-11-01 MED ORDER — 0.9 % SODIUM CHLORIDE (POUR BTL) OPTIME
TOPICAL | Status: DC | PRN
  Administered 2016-11-01: 1000 mL

## 2016-11-01 MED ORDER — DOCUSATE SODIUM 100 MG PO CAPS: 100.0000 mg | ORAL_CAPSULE | Freq: Two times a day (BID) | ORAL | 0 refills | Status: AC

## 2016-11-01 MED ORDER — LIDOCAINE-EPINEPHRINE (PF) 1 %-1:200000 IJ SOLN
INTRAMUSCULAR | Status: AC
  Filled 2016-11-01: qty 30

## 2016-11-01 MED ORDER — HYDROMORPHONE HCL 1 MG/ML IJ SOLN: 0.2500 mg | INTRAMUSCULAR | Status: DC | PRN

## 2016-11-01 MED ORDER — ACETAMINOPHEN 650 MG RE SUPP: 650.0000 mg | RECTAL | Status: DC | PRN

## 2016-11-01 MED ORDER — MEPERIDINE HCL 50 MG/ML IJ SOLN: 6.2500 mg | INTRAMUSCULAR | Status: DC | PRN

## 2016-11-01 MED ORDER — CHLORHEXIDINE GLUCONATE CLOTH 2 % EX PADS: 6.0000 | MEDICATED_PAD | Freq: Once | CUTANEOUS | Status: DC

## 2016-11-01 MED ORDER — ACETAMINOPHEN 10 MG/ML IV SOLN
INTRAVENOUS | Status: AC
  Filled 2016-11-01: qty 100

## 2016-11-01 MED ORDER — BUPIVACAINE HCL 0.25 % IJ SOLN
INTRAMUSCULAR | Status: AC
  Filled 2016-11-01: qty 1

## 2016-11-01 MED ORDER — PROMETHAZINE HCL 25 MG/ML IJ SOLN: 6.2500 mg | INTRAMUSCULAR | Status: DC | PRN

## 2016-11-01 MED ORDER — CEFAZOLIN SODIUM-DEXTROSE 2-4 GM/100ML-% IV SOLN
INTRAVENOUS | Status: AC
  Filled 2016-11-01: qty 100

## 2016-11-01 MED ORDER — LIDOCAINE HCL (CARDIAC) 20 MG/ML IV SOLN
INTRAVENOUS | Status: DC | PRN
  Administered 2016-11-01: 75 mg via INTRAVENOUS

## 2016-11-01 MED ORDER — HYDROCODONE-ACETAMINOPHEN 5-325 MG PO TABS: 1.0000 | ORAL_TABLET | Freq: Four times a day (QID) | ORAL | 0 refills | Status: DC | PRN

## 2016-11-01 MED ORDER — FENTANYL CITRATE (PF) 100 MCG/2ML IJ SOLN
INTRAMUSCULAR | Status: AC
  Filled 2016-11-01: qty 2

## 2016-11-01 MED ORDER — PROPOFOL 10 MG/ML IV BOLUS
INTRAVENOUS | Status: DC | PRN
  Administered 2016-11-01: 20 mg via INTRAVENOUS

## 2016-11-01 MED ORDER — OXYCODONE HCL 5 MG PO TABS: 5.0000 mg | ORAL_TABLET | ORAL | Status: DC | PRN

## 2016-11-01 MED ORDER — ACETAMINOPHEN 10 MG/ML IV SOLN
1000.0000 mg | Freq: Once | INTRAVENOUS | Status: AC
  Administered 2016-11-01: 1000 mg via INTRAVENOUS

## 2016-11-01 MED ORDER — FENTANYL CITRATE (PF) 100 MCG/2ML IJ SOLN
INTRAMUSCULAR | Status: DC | PRN
  Administered 2016-11-01: 50 ug via INTRAVENOUS

## 2016-11-01 MED ORDER — LIDOCAINE-EPINEPHRINE (PF) 1 %-1:200000 IJ SOLN
INTRAMUSCULAR | Status: DC | PRN
  Administered 2016-11-01: 30 mL

## 2016-11-01 MED ORDER — SODIUM CHLORIDE 0.9 % IV SOLN: 250.0000 mL | INTRAVENOUS | Status: DC | PRN

## 2016-11-01 MED ORDER — FENTANYL CITRATE (PF) 100 MCG/2ML IJ SOLN: 25.0000 ug | INTRAMUSCULAR | Status: DC | PRN

## 2016-11-01 MED ORDER — SODIUM CHLORIDE 0.9% FLUSH: 3.0000 mL | Freq: Two times a day (BID) | INTRAVENOUS | Status: DC

## 2016-11-01 MED ORDER — ACETAMINOPHEN 325 MG PO TABS: 650.0000 mg | ORAL_TABLET | ORAL | Status: DC | PRN

## 2016-11-01 MED ORDER — PROPOFOL 10 MG/ML IV BOLUS
INTRAVENOUS | Status: AC
  Filled 2016-11-01: qty 40

## 2016-11-01 MED ORDER — PROPOFOL 500 MG/50ML IV EMUL
INTRAVENOUS | Status: DC | PRN
  Administered 2016-11-01: 100 ug/kg/min via INTRAVENOUS

## 2016-11-01 MED ORDER — PROPOFOL 10 MG/ML IV BOLUS
INTRAVENOUS | Status: AC
  Filled 2016-11-01: qty 20

## 2016-11-01 MED ORDER — CEFAZOLIN SODIUM-DEXTROSE 2-4 GM/100ML-% IV SOLN
2.0000 g | INTRAVENOUS | Status: AC
  Administered 2016-11-01: 2 g via INTRAVENOUS

## 2016-11-01 MED ORDER — SODIUM CHLORIDE 0.9% FLUSH: 3.0000 mL | INTRAVENOUS | Status: DC | PRN

## 2016-11-01 MED ORDER — LACTATED RINGERS IV SOLN
INTRAVENOUS | Status: DC | PRN
  Administered 2016-11-01: 10:00:00 via INTRAVENOUS

## 2016-11-01 SURGICAL SUPPLY — 26 items
BLADE SURG SZ11 CARB STEEL (BLADE) ×3 IMPLANT
CHLORAPREP W/TINT 26ML (MISCELLANEOUS) ×3 IMPLANT
COVER SURGICAL LIGHT HANDLE (MISCELLANEOUS) ×3 IMPLANT
DECANTER SPIKE VIAL GLASS SM (MISCELLANEOUS) IMPLANT
DERMABOND ADVANCED (GAUZE/BANDAGES/DRESSINGS) ×2
DERMABOND ADVANCED .7 DNX12 (GAUZE/BANDAGES/DRESSINGS) ×1 IMPLANT
DRAPE LAPAROSCOPIC ABDOMINAL (DRAPES) ×3 IMPLANT
DRAPE LAPAROTOMY TRNSV 102X78 (DRAPE) IMPLANT
DRSG PAD ABDOMINAL 8X10 ST (GAUZE/BANDAGES/DRESSINGS) IMPLANT
ELECT REM PT RETURN 9FT ADLT (ELECTROSURGICAL) ×3
ELECTRODE REM PT RTRN 9FT ADLT (ELECTROSURGICAL) ×1 IMPLANT
GAUZE SPONGE 4X4 12PLY STRL (GAUZE/BANDAGES/DRESSINGS) IMPLANT
GLOVE BIO SURGEON STRL SZ 6 (GLOVE) ×3 IMPLANT
GLOVE INDICATOR 6.5 STRL GRN (GLOVE) ×3 IMPLANT
GLOVE SURG SS PI 7.0 STRL IVOR (GLOVE) ×3 IMPLANT
GOWN STRL REUS W/TWL LRG LVL3 (GOWN DISPOSABLE) ×6 IMPLANT
GOWN STRL REUS W/TWL XL LVL3 (GOWN DISPOSABLE) ×3 IMPLANT
KIT BASIN OR (CUSTOM PROCEDURE TRAY) ×3 IMPLANT
LUBRICANT JELLY K Y 4OZ (MISCELLANEOUS) IMPLANT
PACK GENERAL/GYN (CUSTOM PROCEDURE TRAY) ×3 IMPLANT
SUT MNCRL AB 4-0 PS2 18 (SUTURE) ×3 IMPLANT
SUT VICRYL 3 0 (SUTURE) IMPLANT
SWAB COLLECTION DEVICE MRSA (MISCELLANEOUS) IMPLANT
SWAB CULTURE ESWAB REG 1ML (MISCELLANEOUS) IMPLANT
TOWEL OR 17X26 10 PK STRL BLUE (TOWEL DISPOSABLE) ×3 IMPLANT
TOWEL OR NON WOVEN STRL DISP B (DISPOSABLE) ×3 IMPLANT

## 2016-11-01 NOTE — H&P (Signed)
Heather Cisneros Patient #: [id number] DOB: 12/06/1935 Single / Language: [name] / Race: White Female  History of Present Illness  Patient words: This is a very nice 80 year old woman with a long-standing history of lower back cyst. It is never been excised, but it has been lanced once in the past and on another occasion it ruptured without intervention. It has recurred, and has been present for about 5 years and slowly enlarging. It is not painful but she describes it as feeling "bubbly". She denies any associated fevers.  The patient is a 80 year old female.   Other Problems  Back Pain Cholelithiasis Chronic Renal Failure Syndrome Hemorrhoids High blood pressure  Diagnostic Studies History Colonoscopy within last year Mammogram >3 years ago Pap Smear >5 years ago  Allergies  No Known Drug Allergies 09/12/2016  Medication History  Triamcinolone Acetonide (0.1% Ointment, External) Active. Simvastatin (20MG  Tablet, Oral) Active. Verapamil HCl (120MG  Tablet, Oral) Active. Valsartan-Hydrochlorothiazide (160-12.5MG  Tablet, Oral) Active. Pentoxifylline ER (400MG  Tablet ER, Oral) Active. Caltrate 600+D3 Soft (600-800MG -UNIT Tablet Chewable, Oral) Active. Vitamin B-12 (2500MCG Tab Sublingual, Sublingual) Active. Cranberry Concentrate (500MG  Capsule, Oral) Active. Resveratrol Plus (100-100MG  Tablet, Oral) Active. Cetirizine HCl (10MG  Tablet, Oral) Active. Medications Reconciled  Social History  Alcohol use Occasional alcohol use. Caffeine use Carbonated beverages, Coffee, Tea. No drug use Tobacco use Former smoker.  Family History  Alcohol Abuse Father. Colon Cancer Mother. Thyroid problems Brother, Mother.  Pregnancy / Birth History  Age at menarche 15 years. Age of menopause 2651-55 Gravida 5 Maternal age 815-20 Para 5     Review of Systems  General Not Present- Appetite Loss, Chills, Fatigue, Fever, Night Sweats, Weight  Gain and Weight Loss. Skin Present- Rash. Not Present- Change in Wart/Mole, Dryness, Hives, Jaundice, New Lesions, Non-Healing Wounds and Ulcer. HEENT Present- Seasonal Allergies. Not Present- Earache, Hearing Loss, Hoarseness, Nose Bleed, Oral Ulcers, Ringing in the Ears, Sinus Pain, Sore Throat, Visual Disturbances, Wears glasses/contact lenses and Yellow Eyes. Respiratory Not Present- Bloody sputum, Chronic Cough, Difficulty Breathing, Snoring and Wheezing. Breast Not Present- Breast Mass, Breast Pain, Nipple Discharge and Skin Changes. Cardiovascular Not Present- Chest Pain, Difficulty Breathing Lying Down, Leg Cramps, Palpitations, Rapid Heart Rate, Shortness of Breath and Swelling of Extremities. Gastrointestinal Present- Constipation. Not Present- Abdominal Pain, Bloating, Bloody Stool, Change in Bowel Habits, Chronic diarrhea, Difficulty Swallowing, Excessive gas, Gets full quickly at meals, Hemorrhoids, Indigestion, Nausea, Rectal Pain and Vomiting. Female Genitourinary Present- Nocturia. Not Present- Frequency, Painful Urination, Pelvic Pain and Urgency. Musculoskeletal Present- Back Pain and Joint Pain. Not Present- Joint Stiffness, Muscle Pain, Muscle Weakness and Swelling of Extremities. Neurological Not Present- Decreased Memory, Fainting, Headaches, Numbness, Seizures, Tingling, Tremor, Trouble walking and Weakness. Psychiatric Not Present- Anxiety, Bipolar, Change in Sleep Pattern, Depression, Fearful and Frequent crying. Endocrine Not Present- Cold Intolerance, Excessive Hunger, Hair Changes, Heat Intolerance, Hot flashes and New Diabetes. Hematology Not Present- Blood Thinners, Easy Bruising, Excessive bleeding, Gland problems, HIV and Persistent Infections.  Vitals:   11/01/16 0906  BP: 136/80  Pulse: 72  Resp: 16  Temp: 97.7 F (36.5 C)       Physical Exam  The physical exam findings are as follows: Note:She is alert and oriented, appears stated age Extraocular  motion intact, pupils equally round and reactive Moist mucous membranes. Normal hearing Neck without mass or thyromegaly Unlabored respirations, clear bilaterally Regular rate and rhythm, palpable radial pulses bilaterally. Bilateral lower artery venous insufficiency, currently wearing compression stockings Abdomen soft, nontender, no organomegaly or mass Extremities  warm without deformity. Neuro grossly intact, no motor or sensory deficits Psych normal mood and affect Skin: On the back just to the patient's right of midline there is a 4-5 cm cystic subcutaneous mass which is freely mobile, smooth, nontender. There is scarring of the overlying skin but no erythema, induration, or warmth    Assessment & Plan   EPIDERMAL INCLUSION CYST (Working Diagnosis) (L72.0) Story: Lower back, approximately 4-5cm diameter. Will plan excision under anesthesia. -If fever, worsening pain, redness or drainage between now and surgery, you may need to see either primary doctor or us to be treated with antibiotics

## 2016-11-01 NOTE — Discharge Instructions (Signed)
[facility] Office Phone Number [phone]  POST OP INSTRUCTIONS  Always review your discharge instruction sheet given to you by the facility where your surgery was performed.  IF YOU HAVE DISABILITY OR FAMILY LEAVE FORMS, YOU MUST BRING THEM TO THE OFFICE FOR PROCESSING.  DO NOT GIVE THEM TO YOUR DOCTOR.  1. A prescription for pain medication may be given to you upon discharge.  Take your pain medication as prescribed, if needed.  If narcotic pain medicine is not needed, then you may take acetaminophen (Tylenol) or ibuprofen (Advil) as needed. 2. Take your usually prescribed medications unless otherwise directed 3. If you need a refill on your pain medication, please contact your pharmacy.  They will contact our office to request authorization.  Prescriptions will not be filled after 5pm or on week-ends. 4. You should eat very light the first 24 hours after surgery, such as soup, crackers, pudding, etc.  Resume your normal diet the day after surgery. 5. Most patients will experience some swelling and bruising in the area of surgery.  Ice packs will help.  Swelling and bruising can take several days to resolve.  6. It is common to experience some constipation if taking pain medication after surgery.  Increasing fluid intake and taking a stool softener will usually help or prevent this problem from occurring.  A mild laxative (Milk of Magnesia or Miralax) should be taken according to package directions if there are no bowel movements after 48 hours. 7. Unless discharge instructions indicate otherwise, you may remove your bandages 24-48 hours after surgery, and you may shower at that time.  You may have steri-strips (small skin tapes) in place directly over the incision.  These strips should be left on the skin for 7-10 days.  If your surgeon used skin glue on the incision, you may shower in 24 hours.  The glue will flake off over the next 2-3 weeks.  Any sutures or staples will be  removed at the office during your follow-up visit. 8. ACTIVITIES:  You may resume regular daily activities (gradually increasing) beginning the next day.  You may have sexual intercourse when it is comfortable. a. You may drive when you no longer are taking prescription pain medication, you can comfortably wear a seatbelt, and you can safely maneuver your car and apply brakes. b. RETURN TO WORK:  _1week_______________________________________________________________________________ Heather QuinYou should see your doctor in the office for a follow-up appointment approximately three weeks after your surgery.    WHEN TO CALL YOUR DOCTOR: 1. Fever over 101.0 2. Nausea and/or vomiting. 3. Extreme swelling or bruising. 4. Continued bleeding from incision. 5. Increased pain, redness, or drainage from the incision.  The clinic staff is available to answer your questions during regular business hours.  Please dont hesitate to call and ask to speak to one of the nurses for clinical concerns.  If you have a medical emergency, go to the nearest emergency room or call 911.  [facility] is always on call at the hospital.  For further questions, please visit [email or web address]

## 2016-11-01 NOTE — Anesthesia Postprocedure Evaluation (Signed)
Anesthesia Post Note  Patient: Heather ProudFrances Grondahl  Procedure(s) Performed: Procedure(s) (LRB): EXCISION CYST OF LOWER BACK (N/A)  Patient location during evaluation: PACU Anesthesia Type: MAC Level of consciousness: awake and alert Pain management: pain level controlled Vital Signs Assessment: post-procedure vital signs reviewed and stable Respiratory status: spontaneous breathing Cardiovascular status: stable Anesthetic complications: no       Last Vitals:  Vitals:   11/01/16 1223 11/01/16 1253  BP: 140/61 (!) 160/67  Pulse: (!) 59 63  Resp: 16 16  Temp: 36.5 C     Last Pain:  Vitals:   11/01/16 0941  TempSrc:   PainSc: 0-No pain                 Lewie LoronJohn Sybrina Laning

## 2016-11-01 NOTE — Op Note (Signed)
Operative Note  Cameron ProudFrances Hjort  [id number]  [id number]  11/01/2016   Surgeon: Berna Buehelsea A Connor  Assistant: none  Procedure performed: Excision of subcutaneous cyst from the right lower back, cyst approximately 5cm in diameter  Preop diagnosis: sebaceous cyst 5cm diameter Post-op diagnosis/intraop findings: same  Specimens: back cyst Retained items: none EBL: <5cc Complications: none  Description of procedure: After obtaining informed consent the patient was taken to the operating room and placed prone [facility], preoperative antibiotics were administered, SCDs applied, and a formal timeout was performed. The lower back was prepped and draped in the usual sterile fashion and the skin surrounding the cyst was infiltrated with 1% lidocaine with epinephrine. A horizontal ellipse incision was sharply created around the cyst and the dermis and soft tissues dissected away from the cyst wall using electrocautery. This dissection was carried down until the cyst was completely excised, intact. The wound was irrigated with sterile saline and hemostasis meticulously ensured. The remainder of the local was infiltrated into the skin and soft tissue surrounding the wound. Interrupted 3-0 vicryl deep dermal sutures were used to reapproximate the wound followed by running subcuticular monocryl and dermabond. The patient was then returned to the supine position on the stretcher, awakened and taken to PACU in stable condition.   All counts were correct at the completion of the case.

## 2016-11-01 NOTE — Transfer of Care (Signed)
Immediate Anesthesia Transfer of Care Note  Patient: Heather Cisneros  Procedure(s) Performed: Procedure(s): EXCISION CYST OF LOWER BACK (N/A)  Patient Location: PACU  Anesthesia Type:MAC  Level of Consciousness: awake, alert  and oriented  Airway & Oxygen Therapy: Patient Spontanous Breathing  Post-op Assessment: stable  Post vital signs: Reviewed and stable  Last Vitals:  Vitals:   11/01/16 0906  BP: 136/80  Pulse: 72  Resp: 16  Temp: 36.5 C    Last Pain:  Vitals:   11/01/16 0941  TempSrc:   PainSc: 0-No pain      Patients Stated Pain Goal: 3 (11/01/16 0941)  Complications: No apparent anesthesia complications

## 2016-11-01 NOTE — Anesthesia Preprocedure Evaluation (Signed)
Anesthesia Evaluation  Patient identified by MRN, date of birth, ID band Patient awake    Reviewed: Allergy & Precautions, NPO status , Patient's Chart, lab work & pertinent test results  Airway Mallampati: II  TM Distance: >3 FB Neck ROM: Full    Dental no notable dental hx.    Pulmonary former smoker,    Pulmonary exam normal breath sounds clear to auscultation       Cardiovascular hypertension, Pt. on medications + Peripheral Vascular Disease  Normal cardiovascular exam Rhythm:Regular Rate:Normal     Neuro/Psych negative neurological ROS  negative psych ROS   GI/Hepatic negative GI ROS, Neg liver ROS,   Endo/Other  diabetes  Renal/GU negative Renal ROS     Musculoskeletal negative musculoskeletal ROS (+)   Abdominal (+) + obese,   Peds  Hematology negative hematology ROS (+)   Anesthesia Other Findings   Reproductive/Obstetrics negative OB ROS                             Anesthesia Physical  Anesthesia Plan  ASA: III  Anesthesia Plan: MAC   Post-op Pain Management:    Induction:   Airway Management Planned: Natural Airway  Additional Equipment:   Intra-op Plan:   Post-operative Plan:   Informed Consent: I have reviewed the patients History and Physical, chart, labs and discussed the procedure including the risks, benefits and alternatives for the proposed anesthesia with the patient or authorized representative who has indicated his/her understanding and acceptance.   Dental advisory given  Plan Discussed with: CRNA  Anesthesia Plan Comments:         Anesthesia Quick Evaluation

## 2016-11-05 ENCOUNTER — Telehealth (HOSPITAL_COMMUNITY): Payer: Self-pay | Admitting: *Deleted

## 2016-11-13 ENCOUNTER — Emergency Department (HOSPITAL_COMMUNITY): Payer: Medicare HMO

## 2016-11-13 ENCOUNTER — Encounter (HOSPITAL_COMMUNITY): Payer: Self-pay | Admitting: Emergency Medicine

## 2016-11-13 ENCOUNTER — Emergency Department (HOSPITAL_COMMUNITY)
Admission: EM | Admit: 2016-11-13 | Discharge: 2016-11-13 | Disposition: A | Discharge status: Home / Self Care | Payer: Medicare HMO | Attending: Emergency Medicine | Admitting: Emergency Medicine

## 2016-11-13 DIAGNOSIS — I10 Essential (primary) hypertension: Secondary | ICD-10-CM | POA: Insufficient documentation

## 2016-11-13 DIAGNOSIS — Z87891 Personal history of nicotine dependence: Secondary | ICD-10-CM | POA: Insufficient documentation

## 2016-11-13 DIAGNOSIS — B029 Zoster without complications: Secondary | ICD-10-CM | POA: Insufficient documentation

## 2016-11-13 DIAGNOSIS — R21 Rash and other nonspecific skin eruption: Secondary | ICD-10-CM | POA: Diagnosis present

## 2016-11-13 MED ORDER — VALACYCLOVIR HCL 1 G PO TABS: 1000.0000 mg | ORAL_TABLET | Freq: Three times a day (TID) | ORAL | 0 refills | Status: AC

## 2016-11-13 MED ORDER — PREDNISONE 20 MG PO TABS: 40.0000 mg | ORAL_TABLET | Freq: Every day | ORAL | 0 refills | Status: DC

## 2016-11-13 MED ORDER — HYDROCODONE-ACETAMINOPHEN 5-325 MG PO TABS: 1.0000 | ORAL_TABLET | Freq: Four times a day (QID) | ORAL | 0 refills | Status: DC | PRN
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
## 2016-11-13 NOTE — ED Triage Notes (Signed)
Patient reports painful rash with blisters to right breast, torso and back x3 days. Patient also reports SOB with non productive cough since last night. Denies chest pain, abdominal pain, and N/V/D.

## 2017-10-06 IMAGING — CR DG CHEST 2V
2 series · 2 of 2 positions shown · non-contrast
Comparison: 10/29/2016

CLINICAL DATA: Shortness of Breath

EXAM:
CHEST  2 VIEW

[w chest pa]
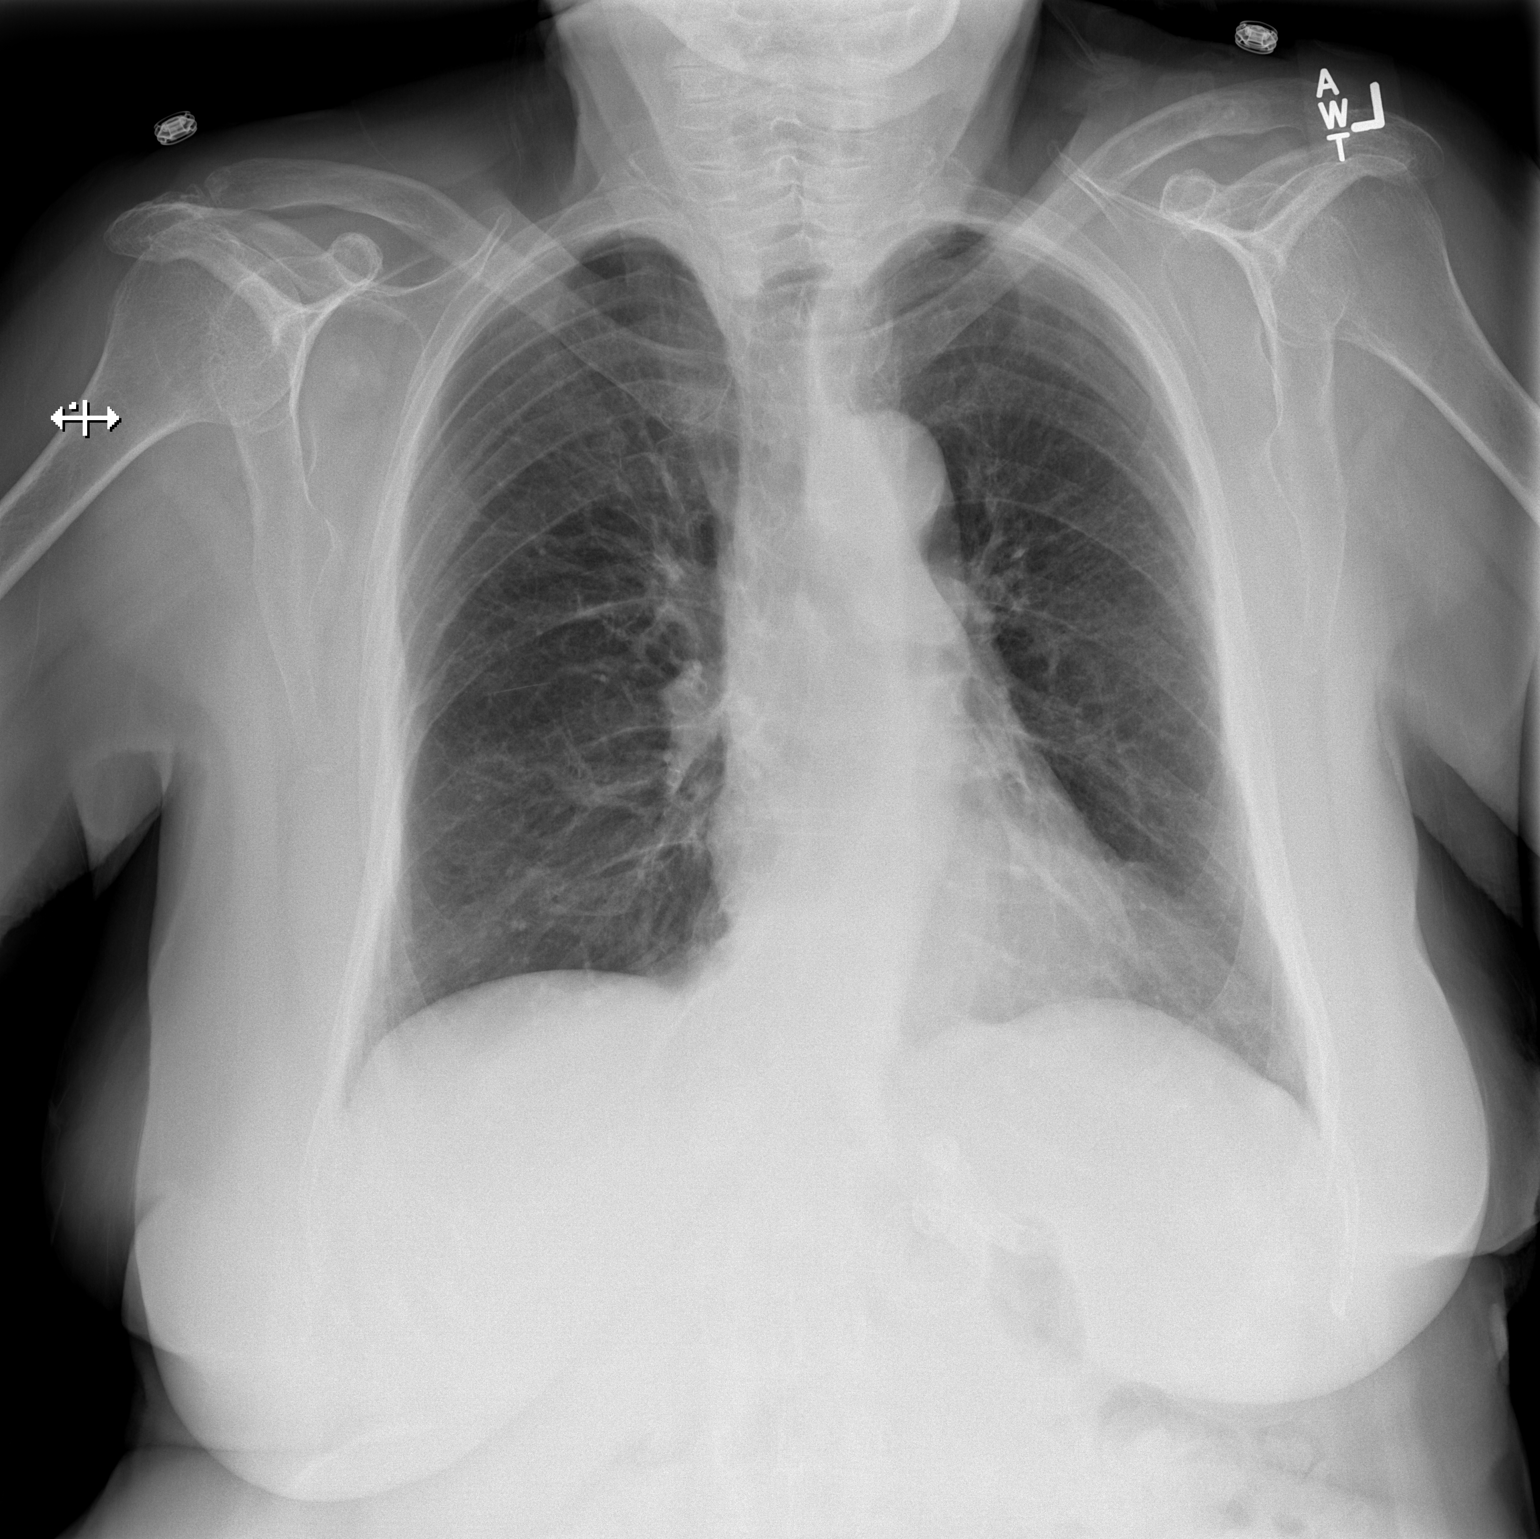

[w chest lat]
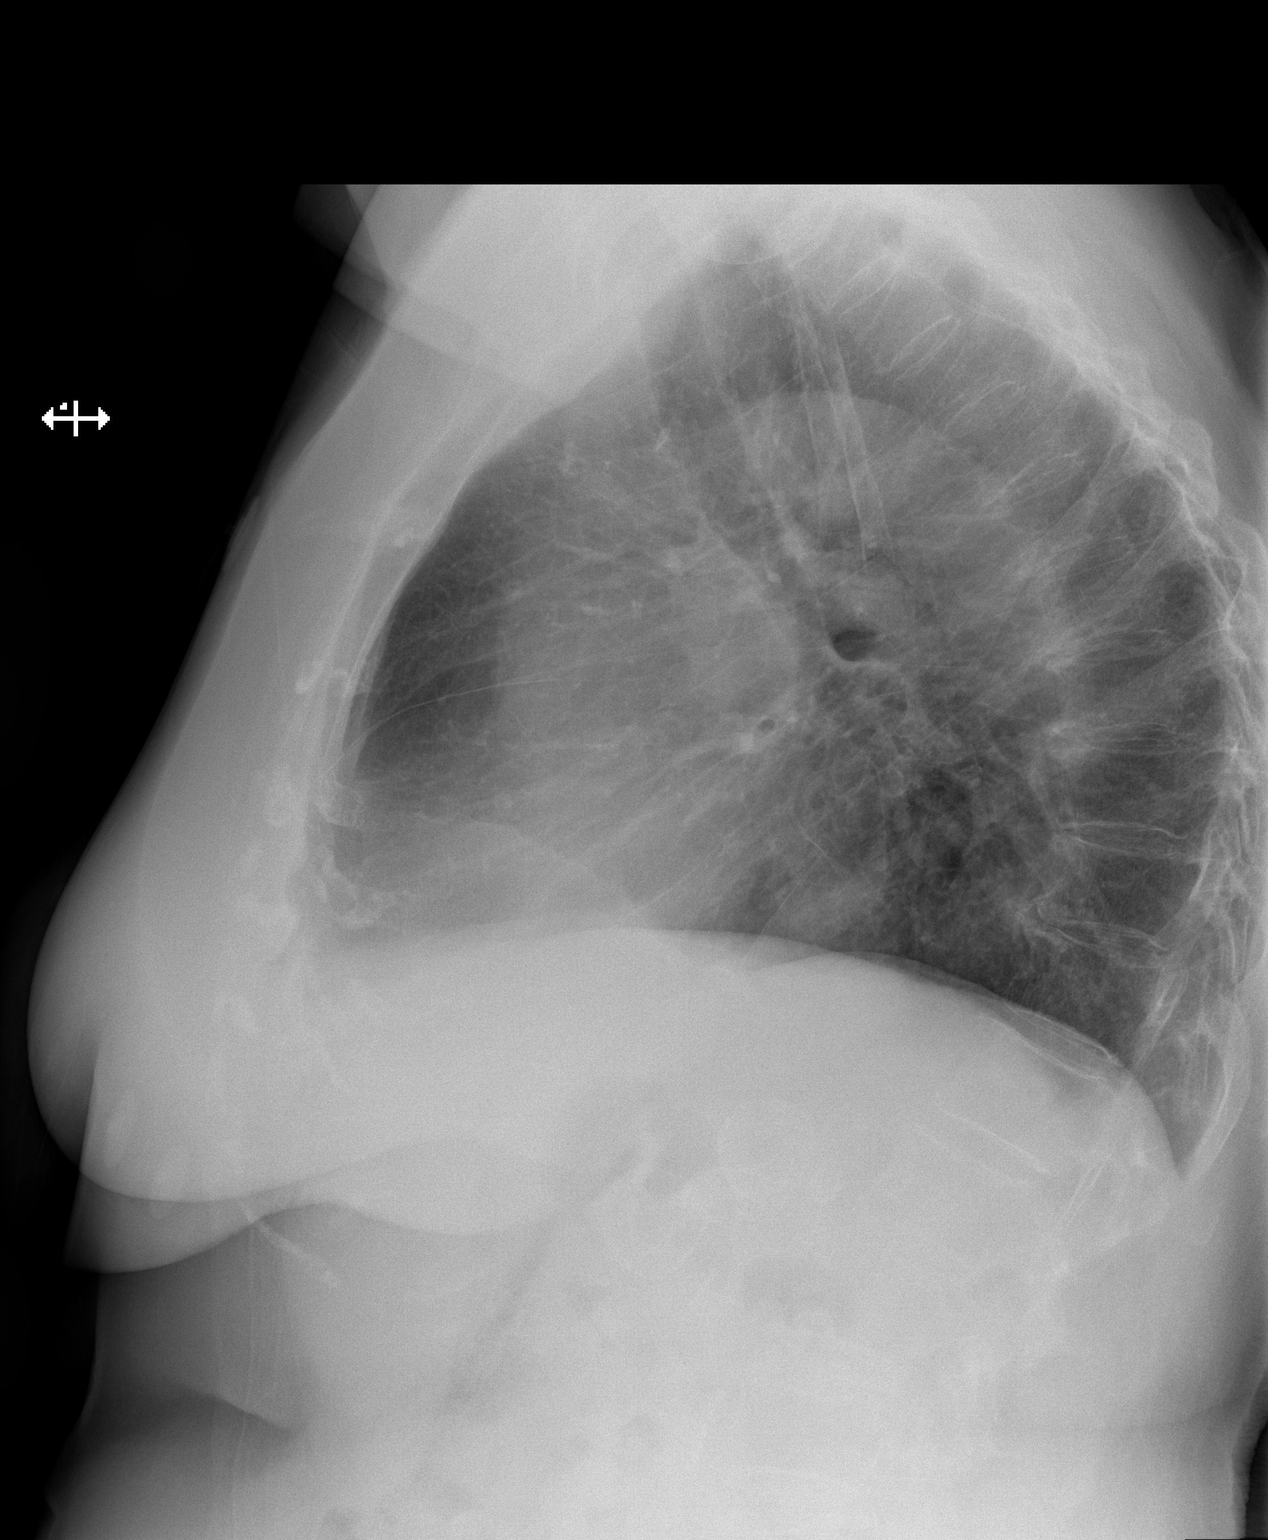

[2 of 2 positions shown; findings below may reference images not displayed]

FINDINGS: Heart and mediastinal contours are within normal limits. No focal
opacities or effusions. No acute bony abnormality.
IMPRESSION: No active cardiopulmonary disease.

## 2018-12-20 ENCOUNTER — Emergency Department (HOSPITAL_COMMUNITY): Payer: Medicare PPO

## 2018-12-20 ENCOUNTER — Encounter (HOSPITAL_COMMUNITY): Payer: Self-pay | Admitting: Emergency Medicine

## 2018-12-20 ENCOUNTER — Other Ambulatory Visit: Payer: Self-pay

## 2018-12-20 ENCOUNTER — Emergency Department (HOSPITAL_COMMUNITY)
Admission: EM | Admit: 2018-12-20 | Discharge: 2018-12-20 | Disposition: A | Discharge status: Home / Self Care | Payer: Medicare PPO | Attending: Emergency Medicine | Admitting: Emergency Medicine

## 2018-12-20 DIAGNOSIS — Z9049 Acquired absence of other specified parts of digestive tract: Secondary | ICD-10-CM | POA: Diagnosis not present

## 2018-12-20 DIAGNOSIS — Y998 Other external cause status: Secondary | ICD-10-CM | POA: Insufficient documentation

## 2018-12-20 DIAGNOSIS — M545 Low back pain: Secondary | ICD-10-CM | POA: Insufficient documentation

## 2018-12-20 DIAGNOSIS — Y929 Unspecified place or not applicable: Secondary | ICD-10-CM | POA: Insufficient documentation

## 2018-12-20 DIAGNOSIS — S22080A Wedge compression fracture of T11-T12 vertebra, initial encounter for closed fracture: Secondary | ICD-10-CM | POA: Diagnosis not present

## 2018-12-20 DIAGNOSIS — Z79899 Other long term (current) drug therapy: Secondary | ICD-10-CM | POA: Insufficient documentation

## 2018-12-20 DIAGNOSIS — Z87891 Personal history of nicotine dependence: Secondary | ICD-10-CM | POA: Diagnosis not present

## 2018-12-20 DIAGNOSIS — Y9389 Activity, other specified: Secondary | ICD-10-CM | POA: Insufficient documentation

## 2018-12-20 DIAGNOSIS — I10 Essential (primary) hypertension: Secondary | ICD-10-CM | POA: Insufficient documentation

## 2018-12-20 DIAGNOSIS — X58XXXA Exposure to other specified factors, initial encounter: Secondary | ICD-10-CM | POA: Diagnosis not present

## 2018-12-20 DIAGNOSIS — S299XXA Unspecified injury of thorax, initial encounter: Secondary | ICD-10-CM | POA: Diagnosis present

## 2018-12-20 LAB — CBC WITH DIFFERENTIAL/PLATELET
Abs Immature Granulocytes: 0.07 10*3/uL (ref 0.00–0.07)
Basophils Absolute: 0 10*3/uL (ref 0.0–0.1)
Basophils Relative: 1 %
Eosinophils Absolute: 0.2 10*3/uL (ref 0.0–0.5)
Eosinophils Relative: 3 %
HCT: 34.9 % — ABNORMAL LOW (ref 36.0–46.0)
Hemoglobin: 11.2 g/dL — ABNORMAL LOW (ref 12.0–15.0)
Immature Granulocytes: 1 %
Lymphocytes Relative: 24 %
Lymphs Abs: 1.8 10*3/uL (ref 0.7–4.0)
MCH: 28.3 pg (ref 26.0–34.0)
MCHC: 32.1 g/dL (ref 30.0–36.0)
MCV: 88.1 fL (ref 80.0–100.0)
Monocytes Absolute: 0.7 10*3/uL (ref 0.1–1.0)
Monocytes Relative: 9 %
NEUTROS ABS: 4.8 10*3/uL (ref 1.7–7.7)
Neutrophils Relative %: 62 %
Platelets: 392 10*3/uL (ref 150–400)
RBC: 3.96 MIL/uL (ref 3.87–5.11)
RDW: 13 % (ref 11.5–15.5)
WBC: 7.7 10*3/uL (ref 4.0–10.5)
nRBC: 0 % (ref 0.0–0.2)

## 2018-12-20 LAB — BASIC METABOLIC PANEL
Anion gap: 10 (ref 5–15)
BUN: 27 mg/dL — ABNORMAL HIGH (ref 8–23)
CO2: 25 mmol/L (ref 22–32)
Calcium: 9.7 mg/dL (ref 8.9–10.3)
Chloride: 99 mmol/L (ref 98–111)
Creatinine, Ser: 1.15 mg/dL — ABNORMAL HIGH (ref 0.44–1.00)
GFR calc Af Amer: 52 mL/min — ABNORMAL LOW (ref >60)
GFR calc non Af Amer: 45 mL/min — ABNORMAL LOW (ref >60)
GLUCOSE: 98 mg/dL (ref 70–99)
Potassium: 4.7 mmol/L (ref 3.5–5.1)
Sodium: 134 mmol/L — ABNORMAL LOW (ref 135–145)

## 2018-12-20 LAB — URINALYSIS, ROUTINE W REFLEX MICROSCOPIC
Bacteria, UA: NONE SEEN
Bilirubin Urine: NEGATIVE
Glucose, UA: NEGATIVE mg/dL
HGB URINE DIPSTICK: NEGATIVE
Ketones, ur: NEGATIVE mg/dL
Nitrite: NEGATIVE
Protein, ur: NEGATIVE mg/dL
Specific Gravity, Urine: 1.004 — ABNORMAL LOW (ref 1.005–1.030)
pH: 7 (ref 5.0–8.0)

## 2018-12-20 MED ORDER — DICLOFENAC SODIUM 25 MG PO TBEC: 25.0000 mg | DELAYED_RELEASE_TABLET | Freq: Two times a day (BID) | ORAL | 0 refills | Status: DC

## 2018-12-20 MED ORDER — HYDROCODONE-ACETAMINOPHEN 5-325 MG PO TABS: 1.0000 | ORAL_TABLET | Freq: Four times a day (QID) | ORAL | 0 refills | Status: DC | PRN

## 2018-12-20 MED ORDER — HYDROCODONE-ACETAMINOPHEN 5-325 MG PO TABS
1.0000 | ORAL_TABLET | Freq: Once | ORAL | Status: AC
  Administered 2018-12-20: 1 via ORAL
  Filled 2018-12-20: qty 1

## 2018-12-20 MED ORDER — TRAMADOL HCL 50 MG PO TABS
50.0000 mg | ORAL_TABLET | Freq: Once | ORAL | Status: AC
  Administered 2018-12-20: 50 mg via ORAL
  Filled 2018-12-20: qty 1

## 2018-12-20 MED ORDER — SODIUM CHLORIDE 0.9 % IV BOLUS
1000.0000 mL | Freq: Once | INTRAVENOUS | Status: AC
Start: 2018-12-20 — End: 2018-12-20
  Administered 2018-12-20: 1000 mL via INTRAVENOUS

## 2018-12-20 NOTE — ED Triage Notes (Signed)
Pt c/o worsening chronic back pain, pt states she recently had xr and has hx of arthritis, pt states she also continues with back pain from when she had shingles several years ago. Pt states her PMD gave her pain meds for only 10 days and now in severe pain.
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
## 2018-12-20 NOTE — Discharge Instructions (Addendum)
You were seen in the emergency department for 1 month of mid back pain.  You had a CAT scan of your thoracic and lumbar spine which showed 2 compression fractures in your lower thoracic vertebra.  We gave you the number for an interventional radiologist who sometimes does procedures on these.  You should also let your primary care doctor know.  We are prescribing you some medication to take for pain.  Please follow-up with your doctor and return if any worsening symptoms or neurologic symptoms.  Report of CT:    1. Osteopenia with age indeterminate but probably acute or subacute  compression fractures of T11 and T12:  - moderate to severe T11 compression fracture with up to 50% loss of  vertebral body height and mild retropulsion of bone resulting in  mild spinal stenosis.  - T12 superior endplate compression with 10-20% loss of vertebral  body height, small volume gas suggesting AVN, but no retropulsion or  other complicating features.  - If specific therapy is desired, Thoracic MRI without contrast or  Nuclear Medicine Whole-body Bone Scan would confirm candidacy for  vertebroplasty.  2. Mild T4 superior endplate compression fracture, favor chronic.  3. Mild for age thoracic spine degeneration.  4. Moderate paraesophageal gastric hernia.     CT LUMBAR SPINE:     1.  No acute osseous abnormality in the lumbar spine.  2. Grade 1 anterolisthesis at L4-L5 with moderate to severe  degenerative spinal stenosis.  3. Mild for age lumbar spine degeneration elsewhere.  4. Aortic Atherosclerosis (ICD10-I70.0) and ectatic abdominal aorta  at risk for aneurysm development. Recommend followup by Ultrasound  in 5 years.  This recommendation follows ACR consensus guidelines: White Paper of  the ACR Incidental Findings Committee II on Vascular Findings. J Am  Coll Radiol 2013; 10:789-794.  Aortic aneurysm NOS (ICD10-I71.9)

## 2018-12-22 ENCOUNTER — Other Ambulatory Visit (HOSPITAL_COMMUNITY): Payer: Self-pay | Admitting: Interventional Radiology

## 2018-12-22 DIAGNOSIS — S22080A Wedge compression fracture of T11-T12 vertebra, initial encounter for closed fracture: Secondary | ICD-10-CM

## 2019-01-04 ENCOUNTER — Ambulatory Visit (HOSPITAL_COMMUNITY)
Admission: RE | Admit: 2019-01-04 | Discharge: 2019-01-04 | Disposition: A | Discharge status: Home / Self Care | Payer: Medicare PPO | Source: Ambulatory Visit | Attending: Interventional Radiology | Admitting: Interventional Radiology

## 2019-01-04 ENCOUNTER — Other Ambulatory Visit (HOSPITAL_COMMUNITY): Payer: Self-pay | Admitting: Interventional Radiology

## 2019-01-04 DIAGNOSIS — S22080A Wedge compression fracture of T11-T12 vertebra, initial encounter for closed fracture: Secondary | ICD-10-CM
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
## 2019-01-05 ENCOUNTER — Telehealth: Payer: Self-pay | Admitting: Student

## 2019-01-05 NOTE — Telephone Encounter (Signed)
[facility].  Patient consulted with Dr. Corliss Skains 01/04/2019 for management of compression fractures. MR thoracic spine reviewed today by Dr. Corliss Skains who states patient has compression fractures of her T11 and T12 vertebral bodies that are amenable to an image-guided kyphoplasty/vertebroplasty. Morrie Sheldon (scheduler) aware and states she will schedule patient for procedure.  Please call [facility] with questions/concerns.  Waylan Boga Olivia Royse, PA-C 01/05/2019, 3:02 PM

## 2019-01-11 ENCOUNTER — Other Ambulatory Visit (HOSPITAL_COMMUNITY): Payer: Self-pay | Admitting: Interventional Radiology

## 2019-01-11 ENCOUNTER — Other Ambulatory Visit: Payer: Self-pay | Admitting: Student

## 2019-01-11 DIAGNOSIS — S22080A Wedge compression fracture of T11-T12 vertebra, initial encounter for closed fracture: Secondary | ICD-10-CM

## 2019-01-12 ENCOUNTER — Ambulatory Visit (HOSPITAL_COMMUNITY)
Admission: RE | Admit: 2019-01-12 | Discharge: 2019-01-12 | Disposition: A | Discharge status: Home / Self Care | Payer: Medicare PPO | Source: Ambulatory Visit | Attending: Interventional Radiology | Admitting: Interventional Radiology

## 2019-01-12 ENCOUNTER — Other Ambulatory Visit (HOSPITAL_COMMUNITY): Payer: Self-pay | Admitting: Interventional Radiology

## 2019-01-12 ENCOUNTER — Other Ambulatory Visit: Payer: Self-pay

## 2019-01-12 DIAGNOSIS — S22080A Wedge compression fracture of T11-T12 vertebra, initial encounter for closed fracture: Secondary | ICD-10-CM | POA: Diagnosis present

## 2019-01-12 DIAGNOSIS — X58XXXA Exposure to other specified factors, initial encounter: Secondary | ICD-10-CM | POA: Insufficient documentation

## 2019-01-12 DIAGNOSIS — M810 Age-related osteoporosis without current pathological fracture: Secondary | ICD-10-CM | POA: Diagnosis not present

## 2019-01-12 DIAGNOSIS — E119 Type 2 diabetes mellitus without complications: Secondary | ICD-10-CM | POA: Diagnosis not present

## 2019-01-12 DIAGNOSIS — Z87891 Personal history of nicotine dependence: Secondary | ICD-10-CM | POA: Diagnosis not present

## 2019-01-12 DIAGNOSIS — I10 Essential (primary) hypertension: Secondary | ICD-10-CM | POA: Insufficient documentation

## 2019-01-12 DIAGNOSIS — Z79899 Other long term (current) drug therapy: Secondary | ICD-10-CM | POA: Insufficient documentation

## 2019-01-12 DIAGNOSIS — E785 Hyperlipidemia, unspecified: Secondary | ICD-10-CM | POA: Insufficient documentation

## 2019-01-12 HISTORY — PX: IR VERTEBROPLASTY CERV/THOR BX INC UNI/BIL INC/INJECT/IMAGING: IMG5515

## 2019-01-12 HISTORY — PX: IR VERTEBROPLASTY EA ADDL (T&LS) BX INC UNI/BIL INC INJECT/IMAGING: IMG5517

## 2019-01-12 LAB — URINALYSIS, ROUTINE W REFLEX MICROSCOPIC
Bilirubin Urine: NEGATIVE
Glucose, UA: NEGATIVE mg/dL
HGB URINE DIPSTICK: NEGATIVE
Ketones, ur: NEGATIVE mg/dL
Nitrite: NEGATIVE
Protein, ur: NEGATIVE mg/dL
Specific Gravity, Urine: 1.005 (ref 1.005–1.030)
pH: 6 (ref 5.0–8.0)

## 2019-01-12 LAB — CBC
HCT: 32.7 % — ABNORMAL LOW (ref 36.0–46.0)
Hemoglobin: 10.8 g/dL — ABNORMAL LOW (ref 12.0–15.0)
MCH: 28.6 pg (ref 26.0–34.0)
MCHC: 33 g/dL (ref 30.0–36.0)
MCV: 86.7 fL (ref 80.0–100.0)
Platelets: 433 10*3/uL — ABNORMAL HIGH (ref 150–400)
RBC: 3.77 MIL/uL — ABNORMAL LOW (ref 3.87–5.11)
RDW: 13.3 % (ref 11.5–15.5)
WBC: 7.8 10*3/uL (ref 4.0–10.5)
nRBC: 0 % (ref 0.0–0.2)

## 2019-01-12 LAB — PROTIME-INR
INR: 0.9 (ref 0.8–1.2)
Prothrombin Time: 12.3 seconds (ref 11.4–15.2)

## 2019-01-12 LAB — APTT: aPTT: 30 seconds (ref 24–36)

## 2019-01-12 MED ORDER — MIDAZOLAM HCL 2 MG/2ML IJ SOLN
INTRAMUSCULAR | Status: AC
  Filled 2019-01-12: qty 2

## 2019-01-12 MED ORDER — GELATIN ABSORBABLE 12-7 MM EX MISC
CUTANEOUS | Status: AC
  Administered 2019-01-12: 11:00:00
  Filled 2019-01-12: qty 1

## 2019-01-12 MED ORDER — SODIUM CHLORIDE 0.9 % IV SOLN: INTRAVENOUS | Status: DC

## 2019-01-12 MED ORDER — BUPIVACAINE HCL (PF) 0.5 % IJ SOLN
INTRAMUSCULAR | Status: AC
  Administered 2019-01-12: 30 mL
  Filled 2019-01-12: qty 30

## 2019-01-12 MED ORDER — CEFAZOLIN SODIUM-DEXTROSE 2-4 GM/100ML-% IV SOLN
2.0000 g | Freq: Once | INTRAVENOUS | Status: AC
  Administered 2019-01-12: 2 g via INTRAVENOUS

## 2019-01-12 MED ORDER — FENTANYL CITRATE (PF) 100 MCG/2ML IJ SOLN
INTRAMUSCULAR | Status: AC | PRN
  Administered 2019-01-12: 25 ug via INTRAVENOUS

## 2019-01-12 MED ORDER — SODIUM CHLORIDE 0.9 % IV SOLN: INTRAVENOUS | Status: AC

## 2019-01-12 MED ORDER — IOPAMIDOL (ISOVUE-300) INJECTION 61%
INTRAVENOUS | Status: AC
  Administered 2019-01-12: 6 mL
  Filled 2019-01-12: qty 100

## 2019-01-12 MED ORDER — CEFAZOLIN SODIUM-DEXTROSE 2-4 GM/100ML-% IV SOLN
INTRAVENOUS | Status: AC
  Filled 2019-01-12: qty 100

## 2019-01-12 MED ORDER — MIDAZOLAM HCL 2 MG/2ML IJ SOLN
INTRAMUSCULAR | Status: AC | PRN
  Administered 2019-01-12: 1 mg via INTRAVENOUS

## 2019-01-12 MED ORDER — FENTANYL CITRATE (PF) 100 MCG/2ML IJ SOLN
INTRAMUSCULAR | Status: AC
  Filled 2019-01-12: qty 2

## 2019-01-12 MED ORDER — TOBRAMYCIN SULFATE 1.2 G IJ SOLR
INTRAMUSCULAR | Status: AC
  Administered 2019-01-12: 11:00:00
  Filled 2019-01-12: qty 1.2

## 2019-01-12 NOTE — Discharge Instructions (Signed)
1. No stooping,bending or lifting more than 10 lbs for 2 weeks. 2.Use walker to ambulate for 23 weeks. 3.No driving for 2 weeks. 3.RTCPRN 2 weeks.  KYPHOPLASTY/VERTEBROPLASTY DISCHARGE INSTRUCTIONS  Medications: (check all that apply)     Resume all home medications as before procedure.       Resume your (aspirin/Plavix/Coumadin) on                  Continue your pain medications as prescribed as needed.  Over the next 3-5 days, decrease your pain medication as tolerated.  Over the counter medications (i.e. Tylenol, ibuprofen, and aleve) may be substituted once severe/moderate pain symptoms have subsided.   Wound Care: - Bandages may be removed the day following your procedure.  You may get your incision wet once bandages are removed.  Bandaids may be used to cover the incisions until scab formation.  Topical ointments are optional.  - If you develop a fever greater than 101 degrees, have increased skin redness at the incision sites or pus-like oozing from incisions occurring within 1 week of the procedure, contact radiology at [phone] or [phone].  - Ice pack to back for 15-20 minutes 2-3 time per day for first 2-3 days post procedure.  The ice will expedite muscle healing and help with the pain from the incisions.   Activity: - Bedrest today with limited activity for 24 hours post procedure.  - No driving for 48 hours.  - Increase your activity as tolerated after bedrest (with assistance if necessary).  - Refrain from any strenuous activity or heavy lifting (greater than 10 lbs.).   Follow up: - Contact radiology at [phone] or [phone] if any questions/concerns.  - A physician assistant from radiology will contact you in approximately 1 week.  - If a biopsy was performed at the time of your procedure, your referring physician should receive the results in usually 2-3 days.
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
## 2019-01-12 NOTE — Procedures (Signed)
S/P T 11 and T 12 VP 

## 2019-01-13 ENCOUNTER — Encounter (HOSPITAL_COMMUNITY): Payer: Self-pay | Admitting: Interventional Radiology

## 2019-04-04 ENCOUNTER — Encounter (HOSPITAL_COMMUNITY): Payer: Self-pay | Admitting: Emergency Medicine

## 2019-04-04 ENCOUNTER — Emergency Department (HOSPITAL_COMMUNITY)
Admission: EM | Admit: 2019-04-04 | Discharge: 2019-04-04 | Disposition: A | Discharge status: Home / Self Care | Payer: Medicare PPO | Attending: Emergency Medicine | Admitting: Emergency Medicine

## 2019-04-04 ENCOUNTER — Other Ambulatory Visit: Payer: Self-pay

## 2019-04-04 DIAGNOSIS — I10 Essential (primary) hypertension: Secondary | ICD-10-CM | POA: Diagnosis not present

## 2019-04-04 DIAGNOSIS — G609 Hereditary and idiopathic neuropathy, unspecified: Secondary | ICD-10-CM | POA: Insufficient documentation

## 2019-04-04 DIAGNOSIS — Z79899 Other long term (current) drug therapy: Secondary | ICD-10-CM | POA: Diagnosis not present

## 2019-04-04 DIAGNOSIS — E119 Type 2 diabetes mellitus without complications: Secondary | ICD-10-CM | POA: Insufficient documentation

## 2019-04-04 DIAGNOSIS — Z87891 Personal history of nicotine dependence: Secondary | ICD-10-CM | POA: Insufficient documentation

## 2019-04-04 DIAGNOSIS — M79606 Pain in leg, unspecified: Secondary | ICD-10-CM | POA: Diagnosis present

## 2019-04-04 LAB — CBG MONITORING, ED: Glucose-Capillary: 110 mg/dL — ABNORMAL HIGH (ref 70–99)

## 2019-04-04 NOTE — ED Triage Notes (Signed)
Pt in with c/o BLE pain and "burning" x 2 days. Hx of vascular issues, pt concerned for blood clot. Area does not appear swollen, bilateral pedal pulses present. Denies any sob

## 2019-04-04 NOTE — ED Notes (Signed)
Patient spoke to her daughter Lupita Leash) on her cell with this RN present. Lupita Leash states she is on her way to pick patient after peeing at [facility]

## 2019-04-04 NOTE — Discharge Instructions (Signed)
The burning pain in your feet seems to be related to a nerve problem.  You seem to have great circulation with good pulses in your feet and there is no sign of blood clots.  The nerve problem may be coming from your back but also you are overdue for your B12 shot as well.  It is very important for you to follow-up with your doctor so they can keep track of this and they may want to start new medications in the future if it is not improving.  You can take 1 or 2 extra strength Tylenol every 4-6 hours as needed for the discomfort
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
## 2020-08-13 ENCOUNTER — Emergency Department (HOSPITAL_COMMUNITY): Payer: Medicare PPO

## 2020-08-13 ENCOUNTER — Encounter (HOSPITAL_COMMUNITY): Payer: Self-pay

## 2020-08-13 ENCOUNTER — Other Ambulatory Visit: Payer: Self-pay

## 2020-08-13 ENCOUNTER — Emergency Department (HOSPITAL_COMMUNITY)
Admission: EM | Admit: 2020-08-13 | Discharge: 2020-08-13 | Disposition: A | Discharge status: Home / Self Care | Payer: Medicare PPO | Attending: Emergency Medicine | Admitting: Emergency Medicine

## 2020-08-13 DIAGNOSIS — E119 Type 2 diabetes mellitus without complications: Secondary | ICD-10-CM | POA: Insufficient documentation

## 2020-08-13 DIAGNOSIS — S8002XA Contusion of left knee, initial encounter: Secondary | ICD-10-CM | POA: Diagnosis not present

## 2020-08-13 DIAGNOSIS — I10 Essential (primary) hypertension: Secondary | ICD-10-CM | POA: Insufficient documentation

## 2020-08-13 DIAGNOSIS — Z79899 Other long term (current) drug therapy: Secondary | ICD-10-CM | POA: Diagnosis not present

## 2020-08-13 DIAGNOSIS — Z87891 Personal history of nicotine dependence: Secondary | ICD-10-CM | POA: Insufficient documentation

## 2020-08-13 DIAGNOSIS — W19XXXA Unspecified fall, initial encounter: Secondary | ICD-10-CM | POA: Insufficient documentation

## 2020-08-13 DIAGNOSIS — S63502A Unspecified sprain of left wrist, initial encounter: Secondary | ICD-10-CM | POA: Diagnosis not present

## 2020-08-13 DIAGNOSIS — S6992XA Unspecified injury of left wrist, hand and finger(s), initial encounter: Secondary | ICD-10-CM | POA: Diagnosis present
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
## 2020-08-13 NOTE — ED Triage Notes (Addendum)
Pt arrived via walk in, c/o left wrist and left knee pain after mechanical fall thurs. Bruising to face. Denies any LOC, denies any blood thinners.

## 2020-08-13 NOTE — Discharge Instructions (Signed)
Do not do any heavy lifting with your left hand/wrist until you are seen by orthopedic/primary care doctor.  You may remove the splint for washing.  Recommend Tylenol Motrin for pain control.

## 2020-11-23 ENCOUNTER — Other Ambulatory Visit: Payer: Self-pay

## 2020-11-23 ENCOUNTER — Encounter (HOSPITAL_COMMUNITY): Payer: Self-pay

## 2020-11-23 DIAGNOSIS — Z87891 Personal history of nicotine dependence: Secondary | ICD-10-CM | POA: Diagnosis not present

## 2020-11-23 DIAGNOSIS — Z79899 Other long term (current) drug therapy: Secondary | ICD-10-CM | POA: Diagnosis not present

## 2020-11-23 DIAGNOSIS — E119 Type 2 diabetes mellitus without complications: Secondary | ICD-10-CM | POA: Diagnosis not present

## 2020-11-23 DIAGNOSIS — I10 Essential (primary) hypertension: Secondary | ICD-10-CM | POA: Insufficient documentation

## 2020-11-23 DIAGNOSIS — F0390 Unspecified dementia without behavioral disturbance: Secondary | ICD-10-CM | POA: Insufficient documentation

## 2020-11-23 DIAGNOSIS — R4182 Altered mental status, unspecified: Secondary | ICD-10-CM | POA: Diagnosis present

## 2020-11-23 LAB — COMPREHENSIVE METABOLIC PANEL
ALT: 20 U/L (ref 0–44)
AST: 29 U/L (ref 15–41)
Albumin: 4.2 g/dL (ref 3.5–5.0)
Alkaline Phosphatase: 68 U/L (ref 38–126)
Anion gap: 10 (ref 5–15)
BUN: 26 mg/dL — ABNORMAL HIGH (ref 8–23)
CO2: 24 mmol/L (ref 22–32)
Calcium: 9.2 mg/dL (ref 8.9–10.3)
Chloride: 102 mmol/L (ref 98–111)
Creatinine, Ser: 1.05 mg/dL — ABNORMAL HIGH (ref 0.44–1.00)
GFR, Estimated: 53 mL/min — ABNORMAL LOW (ref >60)
Glucose, Bld: 97 mg/dL (ref 70–99)
Potassium: 4.5 mmol/L (ref 3.5–5.1)
Sodium: 136 mmol/L (ref 135–145)
Total Bilirubin: 0.5 mg/dL (ref 0.3–1.2)
Total Protein: 6.7 g/dL (ref 6.5–8.1)

## 2020-11-23 LAB — CBC
HCT: 32.2 % — ABNORMAL LOW (ref 36.0–46.0)
Hemoglobin: 10.5 g/dL — ABNORMAL LOW (ref 12.0–15.0)
MCH: 29.2 pg (ref 26.0–34.0)
MCHC: 32.6 g/dL (ref 30.0–36.0)
MCV: 89.4 fL (ref 80.0–100.0)
Platelets: 333 10*3/uL (ref 150–400)
RBC: 3.6 MIL/uL — ABNORMAL LOW (ref 3.87–5.11)
RDW: 13 % (ref 11.5–15.5)
WBC: 7.6 10*3/uL (ref 4.0–10.5)
nRBC: 0 % (ref 0.0–0.2)

## 2020-11-23 LAB — CBG MONITORING, ED: Glucose-Capillary: 83 mg/dL (ref 70–99)

## 2020-11-23 NOTE — ED Triage Notes (Addendum)
Pt arrived via walk in, with daughter, per daughter, pt has been more confused than normal. States she has been progressively becoming more confused the past couple years, but the last four days has been progressively worse. Pt alert and oriented to self, and situation, not able to articulate accurate year or time. No focal deficits, grip strengths equal, no facial droop.

## 2020-11-24 ENCOUNTER — Emergency Department (HOSPITAL_COMMUNITY)
Admission: EM | Admit: 2020-11-24 | Discharge: 2020-11-24 | Disposition: A | Discharge status: Home / Self Care | Payer: Medicare PPO | Attending: Emergency Medicine | Admitting: Emergency Medicine

## 2020-11-24 ENCOUNTER — Emergency Department (HOSPITAL_COMMUNITY): Payer: Medicare PPO

## 2020-11-24 ENCOUNTER — Other Ambulatory Visit: Payer: Self-pay

## 2020-11-24 DIAGNOSIS — F0390 Unspecified dementia without behavioral disturbance: Secondary | ICD-10-CM

## 2020-11-24 LAB — URINALYSIS, ROUTINE W REFLEX MICROSCOPIC
Bacteria, UA: NONE SEEN
Bilirubin Urine: NEGATIVE
Glucose, UA: NEGATIVE mg/dL
Hgb urine dipstick: NEGATIVE
Ketones, ur: NEGATIVE mg/dL
Nitrite: NEGATIVE
Protein, ur: NEGATIVE mg/dL
Specific Gravity, Urine: 1.013 (ref 1.005–1.030)
pH: 5 (ref 5.0–8.0)
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
## 2021-03-02 ENCOUNTER — Emergency Department (HOSPITAL_BASED_OUTPATIENT_CLINIC_OR_DEPARTMENT_OTHER)
Admission: EM | Admit: 2021-03-02 | Discharge: 2021-03-02 | Disposition: A | Discharge status: Home / Self Care | Payer: Medicare PPO | Attending: Emergency Medicine | Admitting: Emergency Medicine

## 2021-03-02 ENCOUNTER — Encounter (HOSPITAL_BASED_OUTPATIENT_CLINIC_OR_DEPARTMENT_OTHER): Payer: Self-pay | Admitting: *Deleted

## 2021-03-02 ENCOUNTER — Other Ambulatory Visit: Payer: Self-pay

## 2021-03-02 ENCOUNTER — Emergency Department (HOSPITAL_BASED_OUTPATIENT_CLINIC_OR_DEPARTMENT_OTHER): Payer: Medicare PPO

## 2021-03-02 DIAGNOSIS — M79605 Pain in left leg: Secondary | ICD-10-CM | POA: Diagnosis not present

## 2021-03-02 DIAGNOSIS — E119 Type 2 diabetes mellitus without complications: Secondary | ICD-10-CM | POA: Insufficient documentation

## 2021-03-02 DIAGNOSIS — Z87891 Personal history of nicotine dependence: Secondary | ICD-10-CM | POA: Diagnosis not present

## 2021-03-02 DIAGNOSIS — Z79899 Other long term (current) drug therapy: Secondary | ICD-10-CM | POA: Diagnosis not present

## 2021-03-02 DIAGNOSIS — R600 Localized edema: Secondary | ICD-10-CM | POA: Diagnosis not present

## 2021-03-02 DIAGNOSIS — I10 Essential (primary) hypertension: Secondary | ICD-10-CM | POA: Insufficient documentation

## 2021-03-02 DIAGNOSIS — M7989 Other specified soft tissue disorders: Secondary | ICD-10-CM

## 2021-03-02 LAB — CBC WITH DIFFERENTIAL/PLATELET
Abs Immature Granulocytes: 0.01 10*3/uL (ref 0.00–0.07)
Basophils Absolute: 0.1 10*3/uL (ref 0.0–0.1)
Basophils Relative: 1 %
Eosinophils Absolute: 0.2 10*3/uL (ref 0.0–0.5)
Eosinophils Relative: 3 %
HCT: 29 % — ABNORMAL LOW (ref 36.0–46.0)
Hemoglobin: 9.5 g/dL — ABNORMAL LOW (ref 12.0–15.0)
Immature Granulocytes: 0 %
Lymphocytes Relative: 21 %
Lymphs Abs: 1.3 10*3/uL (ref 0.7–4.0)
MCH: 27.5 pg (ref 26.0–34.0)
MCHC: 32.8 g/dL (ref 30.0–36.0)
MCV: 84.1 fL (ref 80.0–100.0)
Monocytes Absolute: 0.6 10*3/uL (ref 0.1–1.0)
Monocytes Relative: 10 %
Neutro Abs: 4 10*3/uL (ref 1.7–7.7)
Neutrophils Relative %: 65 %
Platelets: 369 10*3/uL (ref 150–400)
RBC: 3.45 MIL/uL — ABNORMAL LOW (ref 3.87–5.11)
RDW: 14.5 % (ref 11.5–15.5)
WBC: 6.1 10*3/uL (ref 4.0–10.5)
nRBC: 0 % (ref 0.0–0.2)

## 2021-03-02 LAB — BASIC METABOLIC PANEL
Anion gap: 9 (ref 5–15)
BUN: 17 mg/dL (ref 8–23)
CO2: 26 mmol/L (ref 22–32)
Calcium: 9.6 mg/dL (ref 8.9–10.3)
Chloride: 99 mmol/L (ref 98–111)
Creatinine, Ser: 0.84 mg/dL (ref 0.44–1.00)
GFR, Estimated: >60 mL/min (ref >60)
Glucose, Bld: 104 mg/dL — ABNORMAL HIGH (ref 70–99)
Potassium: 4.3 mmol/L (ref 3.5–5.1)
Sodium: 134 mmol/L — ABNORMAL LOW (ref 135–145)

## 2021-03-02 LAB — SEDIMENTATION RATE: Sed Rate: 32 mm/hr — ABNORMAL HIGH (ref 0–22)

## 2021-03-02 LAB — C-REACTIVE PROTEIN: CRP: 0.8 mg/dL (ref <1.0)

## 2021-03-02 MED ORDER — DOXYCYCLINE HYCLATE 100 MG PO CAPS: 100.0000 mg | ORAL_CAPSULE | Freq: Two times a day (BID) | ORAL | 0 refills | Status: DC

## 2021-03-02 MED ORDER — IOHEXOL 350 MG/ML SOLN
100.0000 mL | Freq: Once | INTRAVENOUS | Status: AC | PRN
  Administered 2021-03-02: 100 mL via INTRAVENOUS

## 2021-03-02 MED ORDER — DOXYCYCLINE HYCLATE 100 MG PO CAPS: 100.0000 mg | ORAL_CAPSULE | Freq: Two times a day (BID) | ORAL | 0 refills | Status: AC
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
## 2021-03-02 NOTE — ED Triage Notes (Signed)
Left leg pain for several weeks.  Left leg is swollen.

## 2021-08-17 ENCOUNTER — Emergency Department (HOSPITAL_BASED_OUTPATIENT_CLINIC_OR_DEPARTMENT_OTHER)
Admission: EM | Admit: 2021-08-17 | Discharge: 2021-08-17 | Disposition: A | Discharge status: Home / Self Care | Payer: Medicare PPO | Attending: Emergency Medicine | Admitting: Emergency Medicine

## 2021-08-17 ENCOUNTER — Emergency Department (HOSPITAL_BASED_OUTPATIENT_CLINIC_OR_DEPARTMENT_OTHER): Payer: Medicare PPO

## 2021-08-17 ENCOUNTER — Other Ambulatory Visit: Payer: Self-pay

## 2021-08-17 ENCOUNTER — Encounter (HOSPITAL_BASED_OUTPATIENT_CLINIC_OR_DEPARTMENT_OTHER): Payer: Self-pay

## 2021-08-17 DIAGNOSIS — U071 COVID-19: Secondary | ICD-10-CM | POA: Insufficient documentation

## 2021-08-17 DIAGNOSIS — I959 Hypotension, unspecified: Secondary | ICD-10-CM | POA: Insufficient documentation

## 2021-08-17 DIAGNOSIS — R300 Dysuria: Secondary | ICD-10-CM | POA: Insufficient documentation

## 2021-08-17 DIAGNOSIS — Z87891 Personal history of nicotine dependence: Secondary | ICD-10-CM | POA: Diagnosis not present

## 2021-08-17 DIAGNOSIS — I10 Essential (primary) hypertension: Secondary | ICD-10-CM | POA: Insufficient documentation

## 2021-08-17 DIAGNOSIS — R600 Localized edema: Secondary | ICD-10-CM | POA: Diagnosis not present

## 2021-08-17 DIAGNOSIS — E119 Type 2 diabetes mellitus without complications: Secondary | ICD-10-CM | POA: Diagnosis not present

## 2021-08-17 LAB — TROPONIN I (HIGH SENSITIVITY)
Troponin I (High Sensitivity): 3 ng/L (ref <18)
Troponin I (High Sensitivity): 3 ng/L (ref <18)

## 2021-08-17 LAB — URINALYSIS, ROUTINE W REFLEX MICROSCOPIC
Bilirubin Urine: NEGATIVE
Glucose, UA: NEGATIVE mg/dL
Hgb urine dipstick: NEGATIVE
Ketones, ur: NEGATIVE mg/dL
Nitrite: NEGATIVE
Protein, ur: NEGATIVE mg/dL
Specific Gravity, Urine: 1.015 (ref 1.005–1.030)
Trans Epithel, UA: 1
pH: 6 (ref 5.0–8.0)

## 2021-08-17 LAB — COMPREHENSIVE METABOLIC PANEL
ALT: 10 U/L (ref 0–44)
AST: 21 U/L (ref 15–41)
Albumin: 4.1 g/dL (ref 3.5–5.0)
Alkaline Phosphatase: 92 U/L (ref 38–126)
Anion gap: 10 (ref 5–15)
BUN: 33 mg/dL — ABNORMAL HIGH (ref 8–23)
CO2: 26 mmol/L (ref 22–32)
Calcium: 10.2 mg/dL (ref 8.9–10.3)
Chloride: 102 mmol/L (ref 98–111)
Creatinine, Ser: 1.26 mg/dL — ABNORMAL HIGH (ref 0.44–1.00)
GFR, Estimated: 42 mL/min — ABNORMAL LOW (ref >60)
Glucose, Bld: 106 mg/dL — ABNORMAL HIGH (ref 70–99)
Potassium: 4.2 mmol/L (ref 3.5–5.1)
Sodium: 138 mmol/L (ref 135–145)
Total Bilirubin: 0.4 mg/dL (ref 0.3–1.2)
Total Protein: 6.6 g/dL (ref 6.5–8.1)

## 2021-08-17 LAB — CBC WITH DIFFERENTIAL/PLATELET
Abs Immature Granulocytes: 0.01 10*3/uL (ref 0.00–0.07)
Basophils Absolute: 0 10*3/uL (ref 0.0–0.1)
Basophils Relative: 1 %
Eosinophils Absolute: 0.2 10*3/uL (ref 0.0–0.5)
Eosinophils Relative: 3 %
HCT: 33.7 % — ABNORMAL LOW (ref 36.0–46.0)
Hemoglobin: 11.1 g/dL — ABNORMAL LOW (ref 12.0–15.0)
Immature Granulocytes: 0 %
Lymphocytes Relative: 26 %
Lymphs Abs: 1.7 10*3/uL (ref 0.7–4.0)
MCH: 29.8 pg (ref 26.0–34.0)
MCHC: 32.9 g/dL (ref 30.0–36.0)
MCV: 90.3 fL (ref 80.0–100.0)
Monocytes Absolute: 0.5 10*3/uL (ref 0.1–1.0)
Monocytes Relative: 8 %
Neutro Abs: 4 10*3/uL (ref 1.7–7.7)
Neutrophils Relative %: 62 %
Platelets: 224 10*3/uL (ref 150–400)
RBC: 3.73 MIL/uL — ABNORMAL LOW (ref 3.87–5.11)
RDW: 13.1 % (ref 11.5–15.5)
WBC: 6.4 10*3/uL (ref 4.0–10.5)
nRBC: 0 % (ref 0.0–0.2)

## 2021-08-17 LAB — LACTIC ACID, PLASMA: Lactic Acid, Venous: 1 mmol/L (ref 0.5–1.9)

## 2021-08-17 LAB — RESP PANEL BY RT-PCR (FLU A&B, COVID) ARPGX2
Influenza A by PCR: NEGATIVE
Influenza B by PCR: NEGATIVE
SARS Coronavirus 2 by RT PCR: POSITIVE — AB

## 2021-08-17 MED ORDER — LACTATED RINGERS IV BOLUS
1000.0000 mL | Freq: Once | INTRAVENOUS | Status: AC
  Administered 2021-08-17: 1000 mL via INTRAVENOUS

## 2021-08-17 NOTE — Discharge Instructions (Signed)
She is positive for COVID but the rest of her labs look good.  If she starts having trouble breathing, severe weakness and is unable to get out of bed, refusing to eat or drink please return.  Blood pressure has been normal the entire time she has been here.  You can check her blood pressure in the morning prior to getting her blood pressure medications.  If the blood pressure is less than 115/80 hold the blood pressure medications.

## 2021-08-17 NOTE — ED Triage Notes (Signed)
Per daughter pt has been complaining or burning with urination. Pt was seen at PCP today and they recommended following up here due to BP being low. PCP suspects possible kidney infection.
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
## 2022-01-23 IMAGING — US US EXTREM LOW VENOUS*L*
1 series · 13 of 24 positions shown · non-contrast
Comparison: CTA run-off-earlier same day

CLINICAL DATA: Left lower extremity pain and edema. Evaluate for
DVT.



[Series 1: us venous img lower uni left (dvt) · portal-venous · 55 acquisitions, 13 frames shown]
[im 1/55]
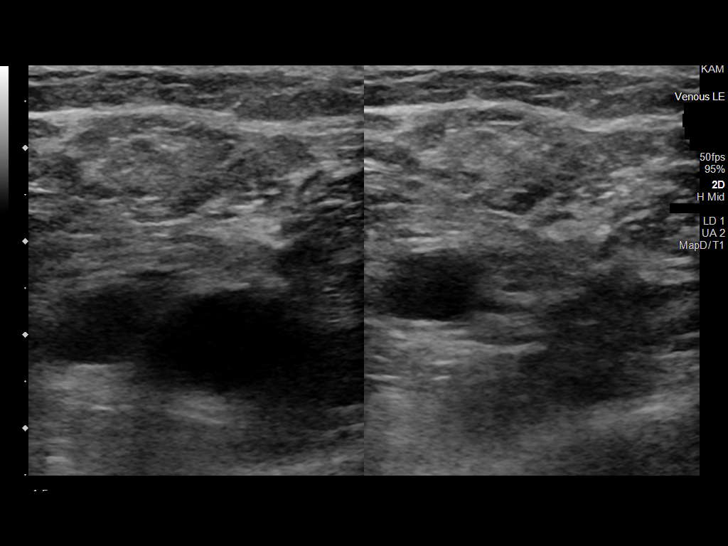
[im 5/55]
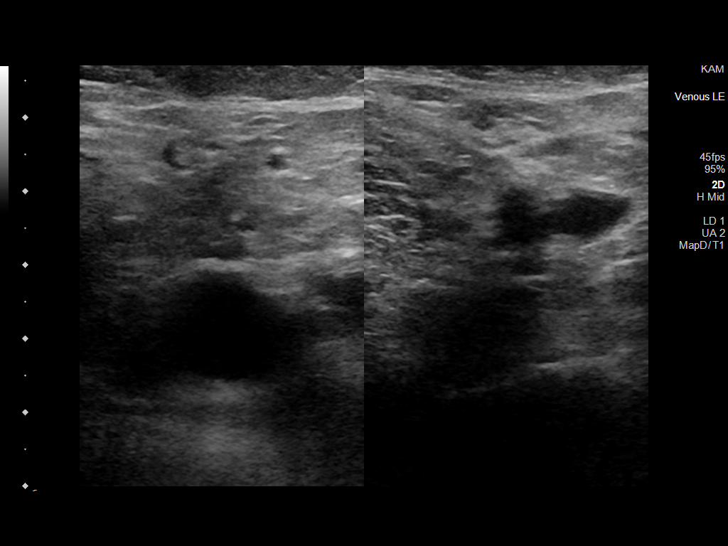
[im 10/55]
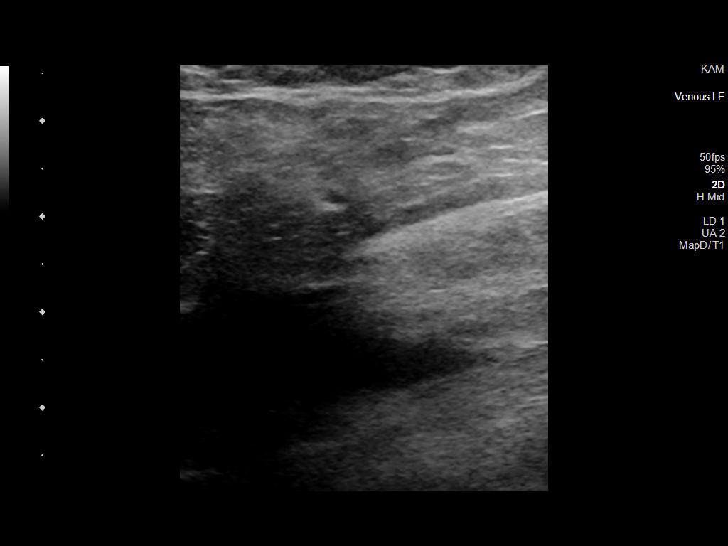
[im 15/55]
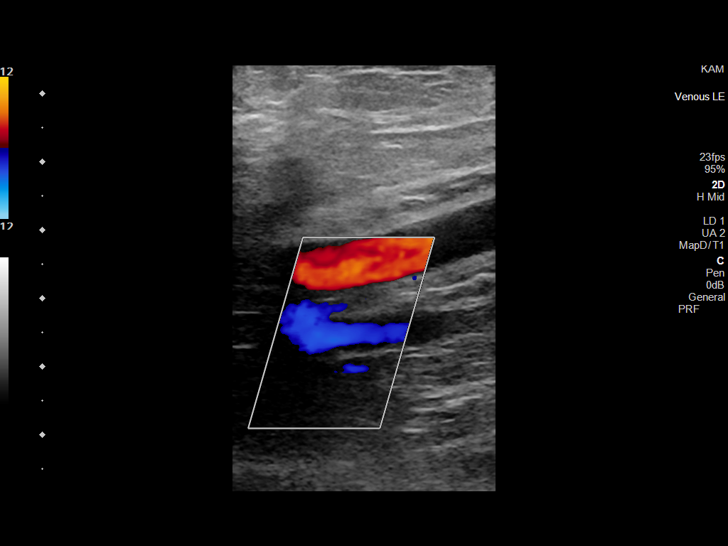
[im 19/55]
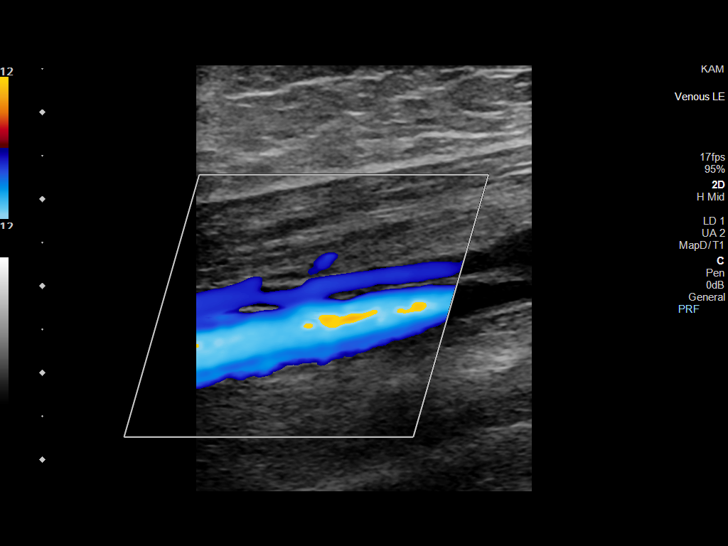
[im 24/55]
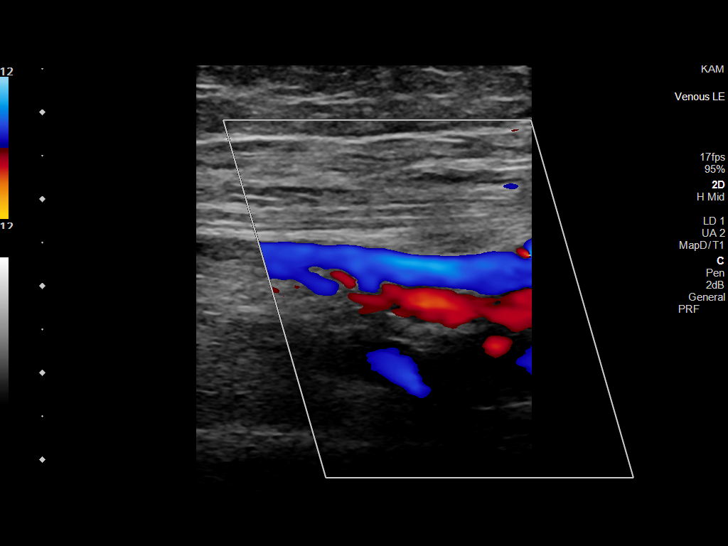
[im 31/55]
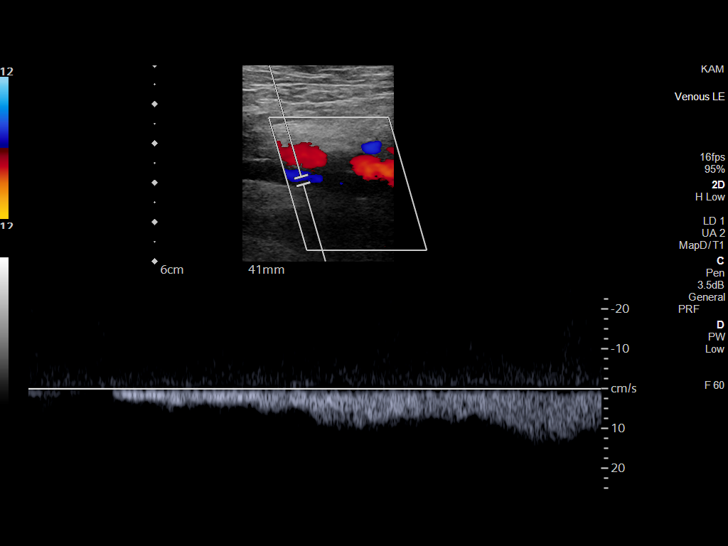
[im 33/55]
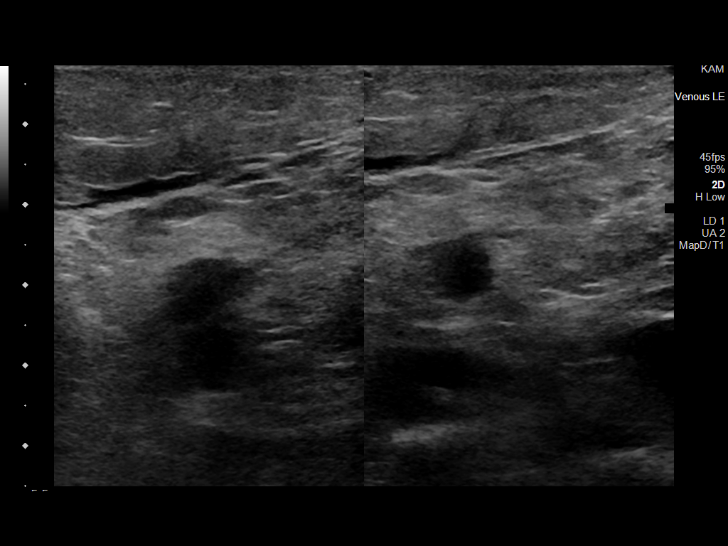
[im 38/55]
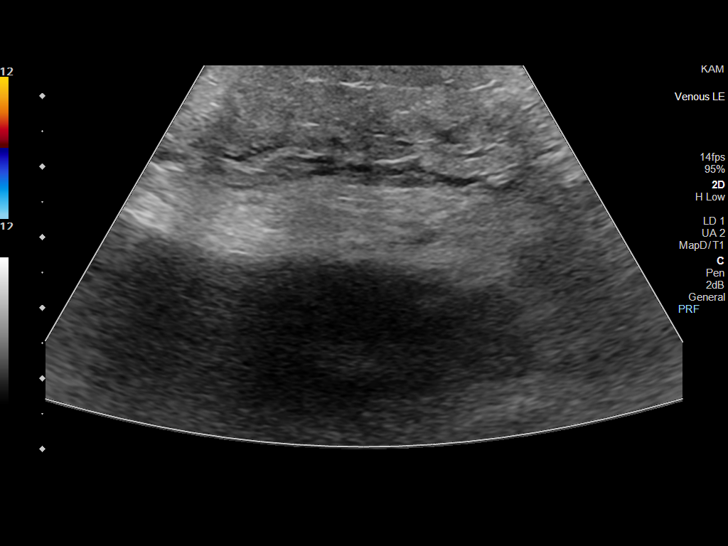
[im 43/55]
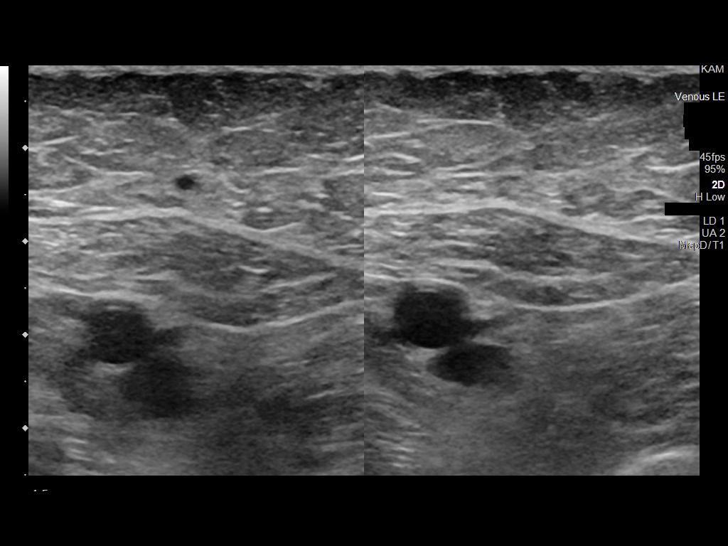
[im 47/55]
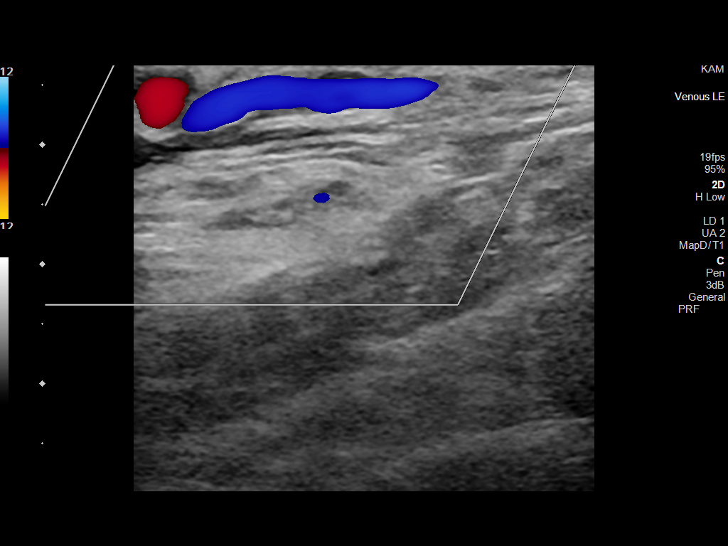
[im 52/55]
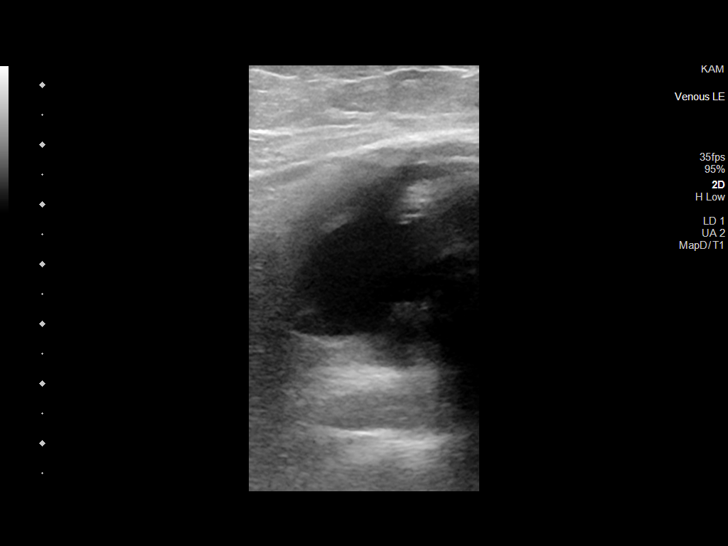
[im 55/55]
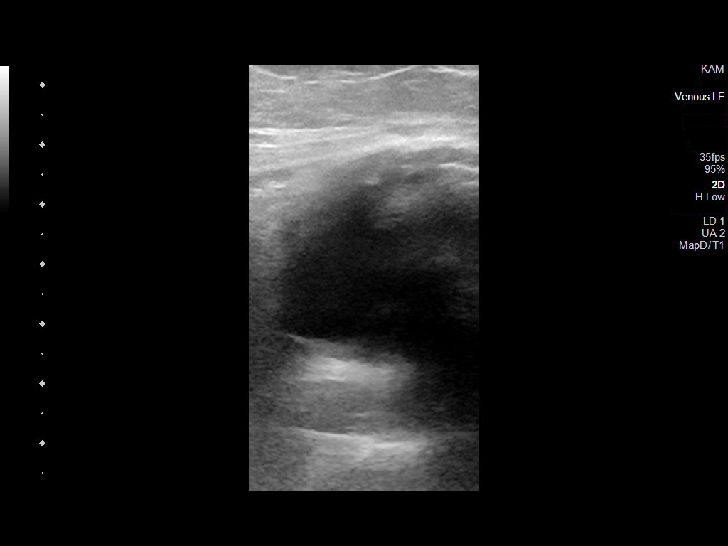

[13 of 24 positions shown; findings below may reference images not displayed]

FINDINGS: Contralateral Common Femoral Vein: Respiratory phasicity is normal
and symmetric with the symptomatic side. No evidence of thrombus.
Normal compressibility.

Common Femoral Vein: No evidence of thrombus. Normal
compressibility, respiratory phasicity and response to augmentation.

Saphenofemoral Junction: No evidence of thrombus. Normal
compressibility and flow on color Doppler imaging.

Profunda Femoral Vein: No evidence of thrombus. Normal
compressibility and flow on color Doppler imaging.

Femoral Vein: No evidence of thrombus. Normal compressibility,
respiratory phasicity and response to augmentation.

Popliteal Vein: No evidence of thrombus. Normal compressibility,
respiratory phasicity and response to augmentation.

Calf Veins: Appear patent where visualized.

Superficial Great Saphenous Vein: No evidence of thrombus. Normal
compressibility.

Venous Reflux:  None.

Other Findings: Note is made of several prominent though patent
varicosities about the medial aspect the left knee (images 45
through 49).

Note is made of an at least 3.5 x 2.0 x 2.2 cm minimally complex
fluid collection about the medial aspect of the left knee compatible
with a component of the left knee joint effusion demonstrated on
preceding CTA run-off.
IMPRESSION: 1. No evidence of DVT within the left lower extremity.
2. Note is made of several mildly prominent though patent
varicosities about the medial aspect of the left knee.
3. Incidentally noted left knee joint effusion as was demonstrated
on preceding CTA run-off.

## 2022-01-23 IMAGING — CT CT ANGIO AOBIFEM WO/W CM
1 of 10 series · 5 of 16 positions shown, 7 images · IV contrast (APPLIED)
Comparison: None.

CLINICAL DATA: 84-year-old female with left lower extremity
claudication, swelling and absent pulses

EXAM:
CT ANGIOGRAPHY OF ABDOMINAL AORTA WITH ILIOFEMORAL RUNOFF
TECHNIQUE: Multidetector CT imaging of the abdomen, pelvis and lower
extremities was performed using the standard protocol during bolus
administration of intravenous contrast. Multiplanar CT image
reconstructions and MIPs were obtained to evaluate the vascular
anatomy.
CONTRAST:  100mL OMNIPAQUE IOHEXOL 350 MG/ML SOLN

[Series 6: arterial thins · axial · arterial · 0.72mm/px · z∈[+104,+927]mm · 5 of 1901 slices shown, 7 images]
[im 317/1901  soft-tissue]
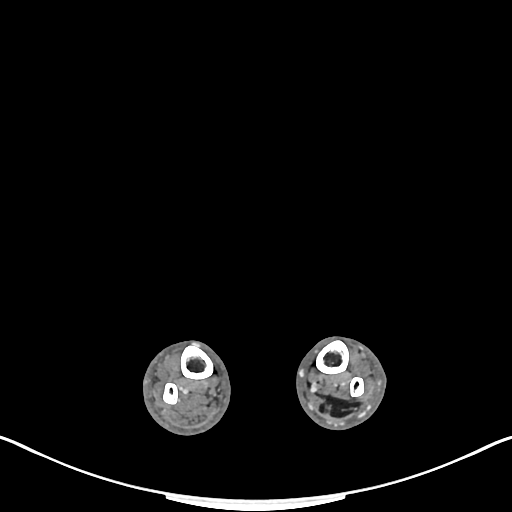
[im 317/1901  bone]
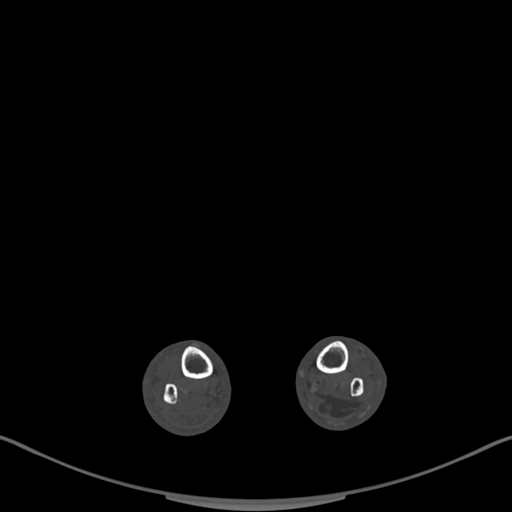
[im 634/1901  soft-tissue]
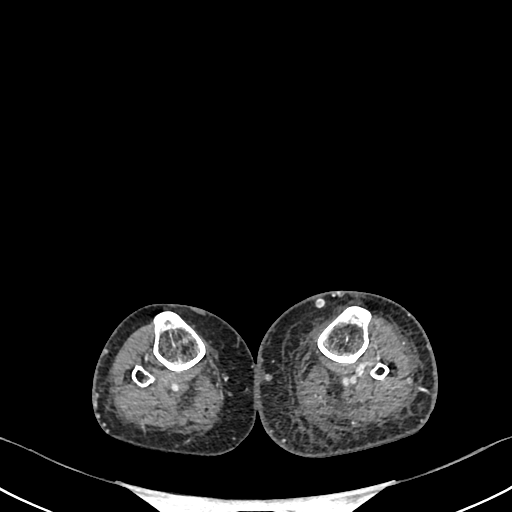
[im 951/1901  soft-tissue]
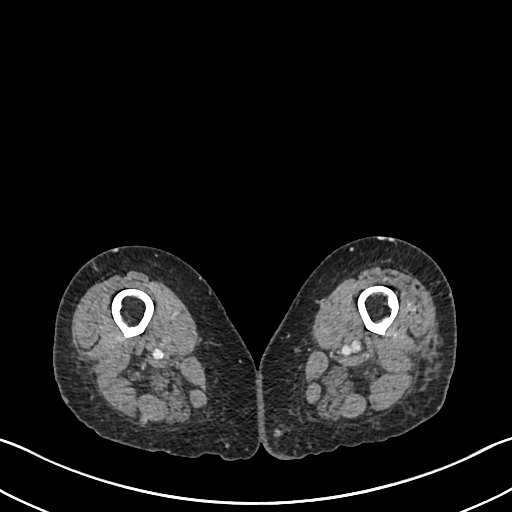
[im 1267/1901  soft-tissue]
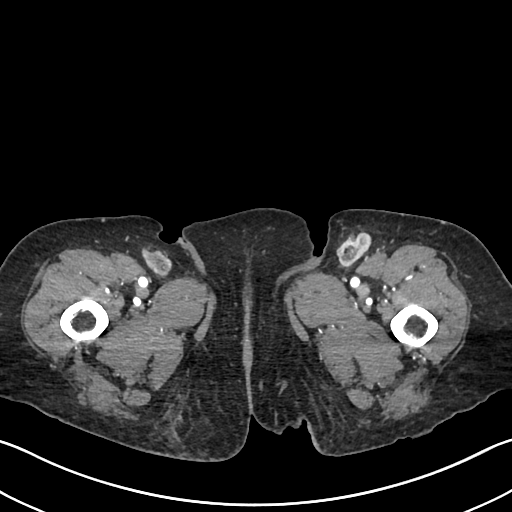
[im 1584/1901  soft-tissue]
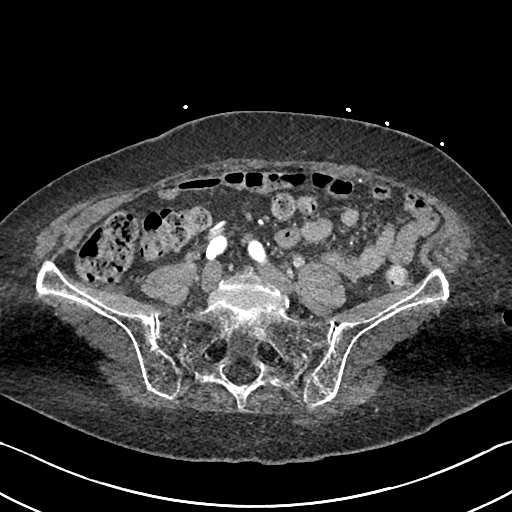
[im 1584/1901  bone]
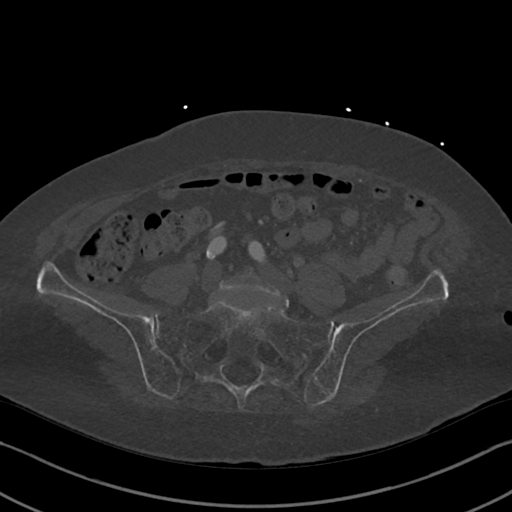

[5 of 16 positions shown; findings below may reference images not displayed]

FINDINGS: VASCULAR

Aorta: Normal caliber aorta without aneurysm, dissection, vasculitis
or significant stenosis. Mild calcified atherosclerotic plaque

Celiac: Patent without evidence of aneurysm, dissection, vasculitis
or significant stenosis.

SMA: Patent without evidence of aneurysm, dissection, vasculitis or
significant stenosis.

Renals: Both renal arteries are patent without evidence of aneurysm,
dissection, vasculitis, fibromuscular dysplasia or significant
stenosis.

IMA: Patent without evidence of aneurysm, dissection, vasculitis or
significant stenosis.

RIGHT Lower Extremity

Inflow: Mild aneurysmal dilation of the right common iliac artery
with a maximal diameter of 1.7 cm. The artery remains patent. No
significant stenosis or dissection. The internal and external iliac
artery are widely patent.

Outflow: Widely patent common femoral, profunda femoral and
superficial femoral arteries. Relatively decreased perfusion of the
popliteal artery. However, the popliteal artery remains patent.

Runoff: Poor evaluation of the runoff arteries secondary to poor
contrast opacification.

LEFT Lower Extremity

Inflow: Common, internal and external iliac arteries are patent
without evidence of aneurysm, dissection, vasculitis or significant
stenosis.

Outflow: Common, superficial and profunda femoral arteries and the
popliteal artery are patent without evidence of aneurysm,
dissection, vasculitis or significant stenosis.

Runoff: Poor evaluation of the runoff arteries secondary to poor
contrast opacification.

Veins: No focal venous abnormality. No evidence of significant iliac
venous compression or DVT within the limitations of non venous phase
timing. There are numerous superficial venous collaterals
bilaterally worse on the left than the right suggesting underlying
venous insufficiency.

Review of the MIP images confirms the above findings.

NON-VASCULAR

Lower chest: Moderate sliding hiatal hernia. Extensive respiratory
motion artifact limits evaluation for small pulmonary nodules. The
lung bases are grossly clear.

Hepatobiliary: No focal liver abnormality is seen. Status post
cholecystectomy. No biliary dilatation.

Pancreas: Unremarkable. No pancreatic ductal dilatation or
surrounding inflammatory changes.

Spleen: Normal in size without focal abnormality.

Adrenals/Urinary Tract: Adrenal glands are unremarkable. Kidneys are
normal, without renal calculi, focal enhancing lesion, or
hydronephrosis. Small simple cyst at the upper pole of the right
kidney. Bladder is unremarkable.

Stomach/Bowel: Colonic diverticular disease without CT evidence of
active inflammation.

Lymphatic: No suspicious adenopathy.

Reproductive: Uterus and bilateral adnexa are unremarkable.

Other: No abdominal wall hernia or abnormality. No abdominopelvic
ascites.

Musculoskeletal: Chronic T11 and T12 compression fractures with
evidence of prior cement augmentation. Mild grade 1 anterolisthesis
of L4 on L5. Multilevel degenerative disc disease and bilateral
lower lumbar facet arthropathy.
IMPRESSION: VASCULAR

1. No evidence of significant stenosis, occlusion or arterial
embolism to the level of the knees bilaterally. Suboptimal valuation
of the runoff (below the knee) arteries secondary to poor contrast
opacification (suspect due to poor cardiac output).
2. Imaging findings suggest superficial venous insufficiency
involving the left lower extremity. This may account for the
patient's left lower extremity swelling.
3.  Aortic Atherosclerosis ([id number]).

NON-VASCULAR

1. No acute abnormality within the abdomen or pelvis.
2. Moderate sliding hiatal hernia.
3. Colonic diverticular disease without CT evidence of active
inflammation.
4. Chronic T11 and T12 thoracic compression fractures with evidence
of prior cement augmentation.
5. Bilateral lower lumbar facet arthropathy.
6. Mild grade 1 anterolisthesis of L4 on L5.

## 2022-08-04 DEATH — deceased
